# Patient Record
Sex: Female | Born: 1990 | Race: White | Hispanic: No | Smoking: Former smoker
Health system: Southern US, Community
[De-identification: ages and names within clinical notes are randomized; demographics above are authoritative.]

## PROBLEM LIST (undated history)

## (undated) ENCOUNTER — Ambulatory Visit: Admission: EM | Payer: Medicaid Other

## (undated) DIAGNOSIS — F419 Anxiety disorder, unspecified: Secondary | ICD-10-CM

## (undated) DIAGNOSIS — J45909 Unspecified asthma, uncomplicated: Secondary | ICD-10-CM

## (undated) DIAGNOSIS — Z8711 Personal history of peptic ulcer disease: Secondary | ICD-10-CM

## (undated) DIAGNOSIS — Z8719 Personal history of other diseases of the digestive system: Secondary | ICD-10-CM

## (undated) HISTORY — PX: NO PAST SURGERIES: SHX2092

---
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
## 2011-05-07 ENCOUNTER — Observation Stay: Payer: Self-pay | Admitting: Surgery

## 2011-09-04 ENCOUNTER — Observation Stay: Payer: Self-pay | Admitting: Advanced Practice Midwife

## 2011-09-04 LAB — URINALYSIS, COMPLETE
Glucose,UR: NEGATIVE mg/dL (ref 0–75)
Nitrite: NEGATIVE
Protein: NEGATIVE
RBC,UR: 2 /HPF (ref 0–5)
Squamous Epithelial: 13

## 2011-09-06 LAB — URINE CULTURE

## 2011-10-03 ENCOUNTER — Inpatient Hospital Stay: Payer: Self-pay

## 2011-10-03 ENCOUNTER — Ambulatory Visit: Payer: Self-pay | Admitting: Advanced Practice Midwife

## 2011-10-03 LAB — CBC WITH DIFFERENTIAL/PLATELET
Basophil %: 0.3 %
Eosinophil #: 0.1 10*3/uL (ref 0.0–0.7)
Eosinophil %: 0.4 %
HCT: 31.3 % — ABNORMAL LOW (ref 35.0–47.0)
HGB: 10.4 g/dL — ABNORMAL LOW (ref 12.0–16.0)
Lymphocyte %: 20.4 %
MCH: 28.7 pg (ref 26.0–34.0)
MCV: 86 fL (ref 80–100)
Monocyte #: 1 10*3/uL — ABNORMAL HIGH (ref 0.0–0.7)
Monocyte %: 7.6 %
Neutrophil %: 71.3 %
WBC: 13.7 10*3/uL — ABNORMAL HIGH (ref 3.6–11.0)

## 2011-10-05 LAB — HEMOGLOBIN: HGB: 10.4 g/dL — ABNORMAL LOW (ref 12.0–16.0)

## 2012-05-19 ENCOUNTER — Emergency Department: Payer: Self-pay | Admitting: Emergency Medicine

## 2013-01-13 IMAGING — US ABDOMEN ULTRASOUND LIMITED
1 series · 17 of 25 positions shown · non-contrast
Comparison: none

REASON FOR EXAM: 36 weeks RUQ pain
COMMENTS:

[Series 1: abdomen ultrasound limited · 17 of 99 slices shown]
[im 1/99]
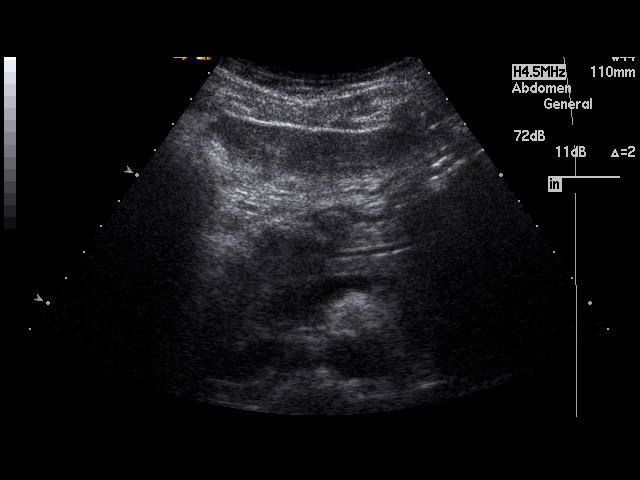
[im 9/99]
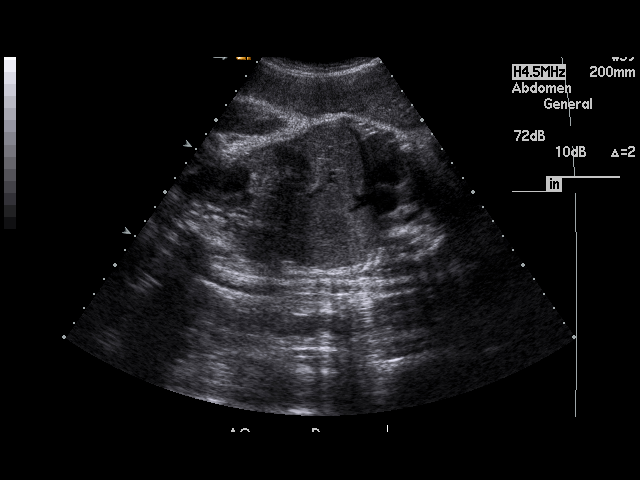
[im 13/99]
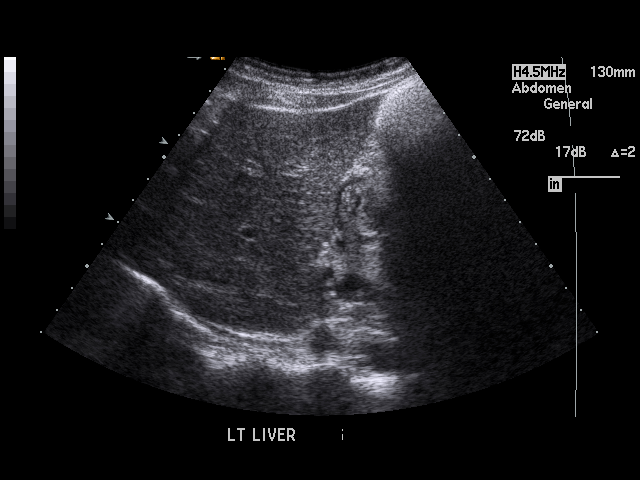
[im 21/99]
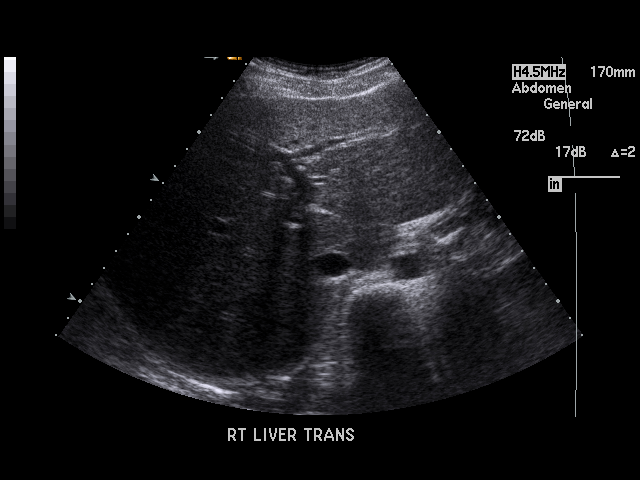
[im 25/99]
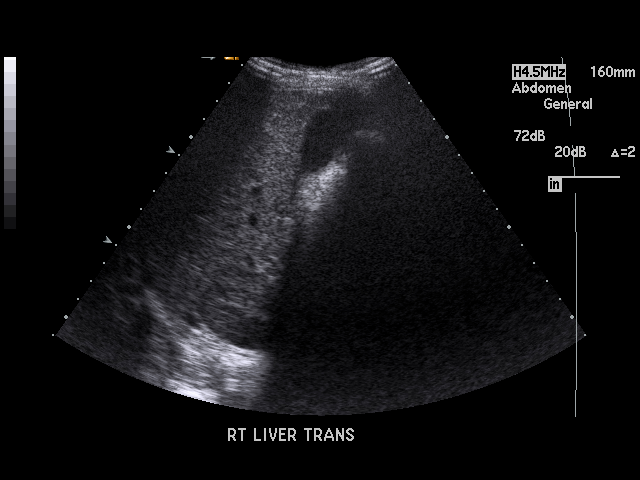
[im 33/99]
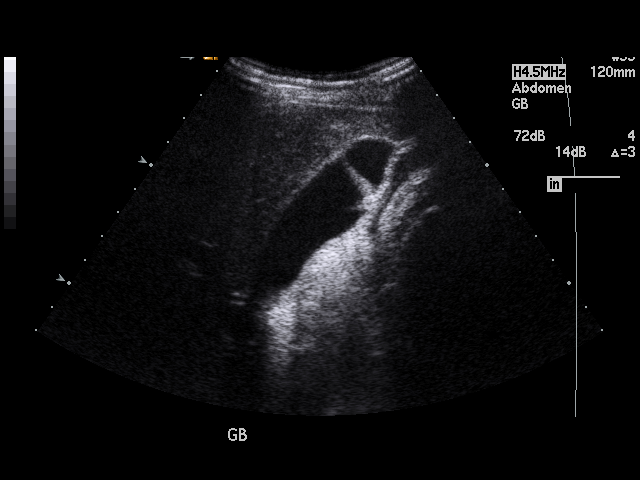
[im 37/99]
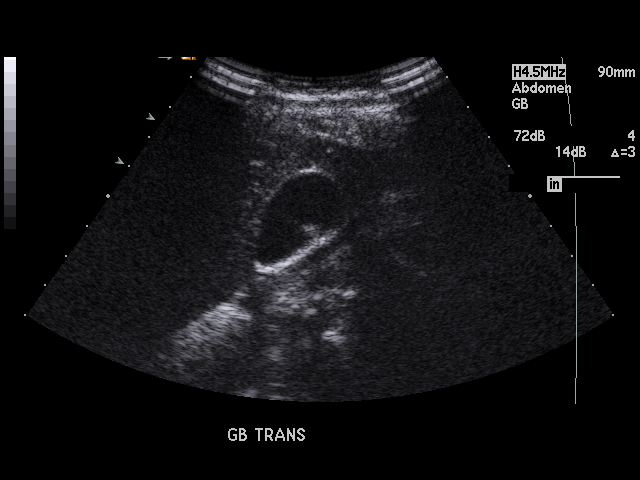
[im 45/99]
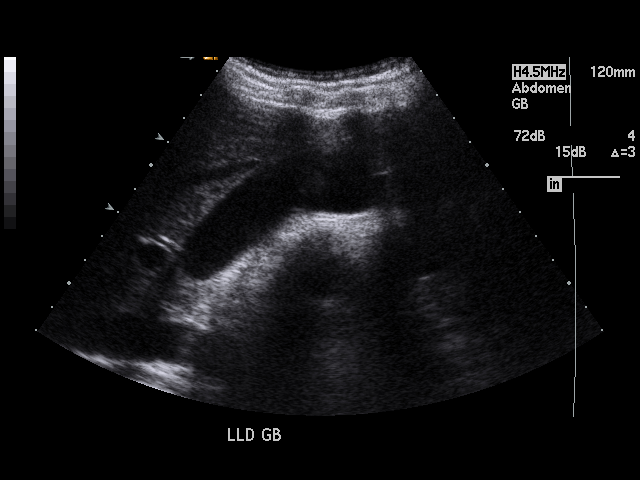
[im 50/99]
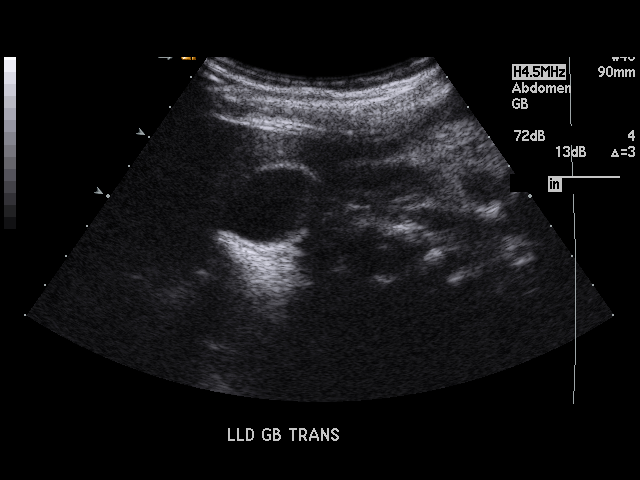
[im 54/99]
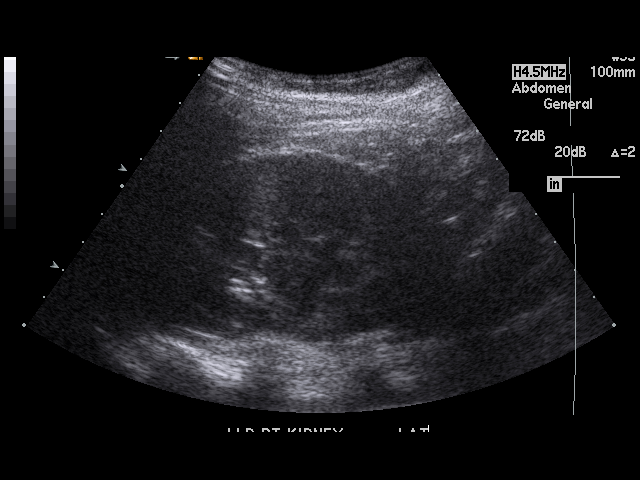
[im 62/99]
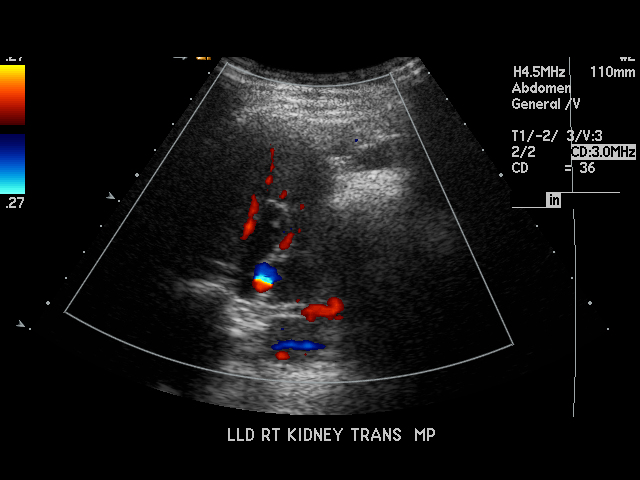
[im 66/99]
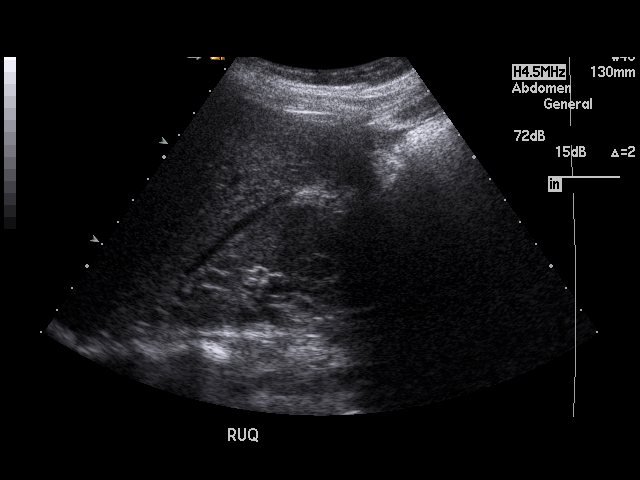
[im 74/99]
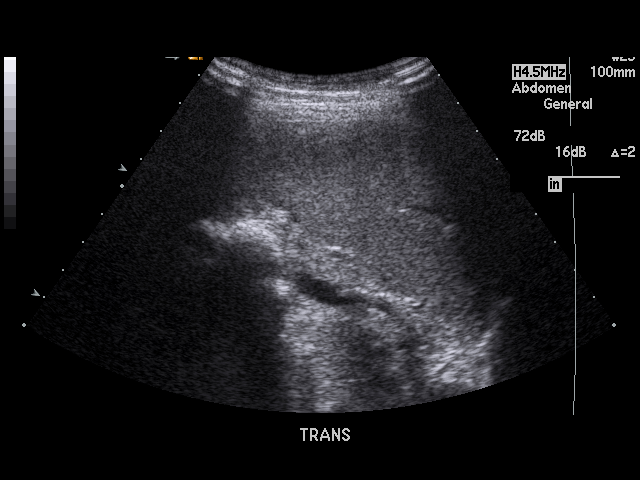
[im 78/99]
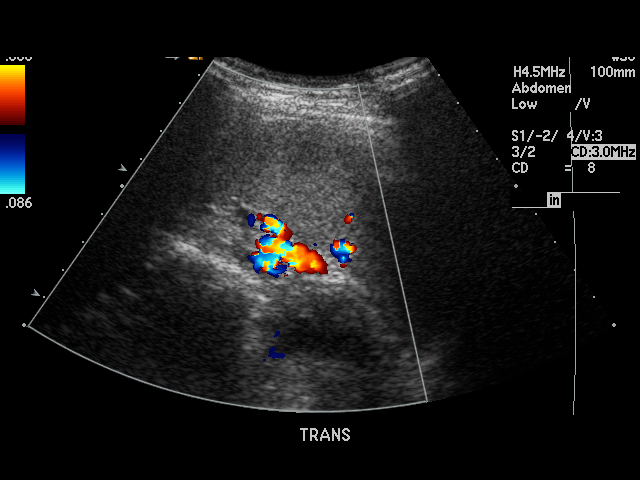
[im 86/99]
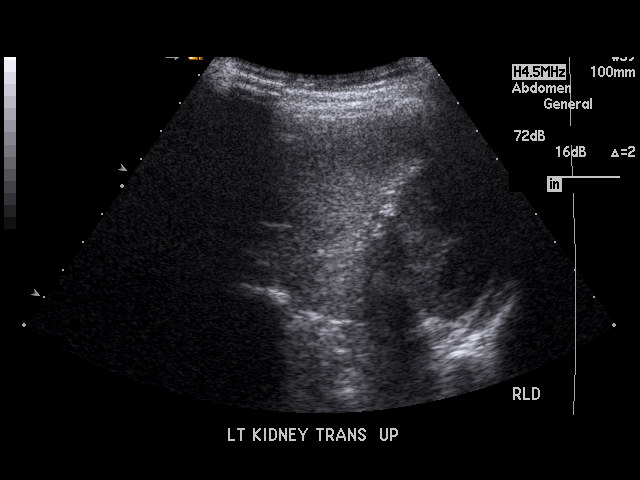
[im 90/99]
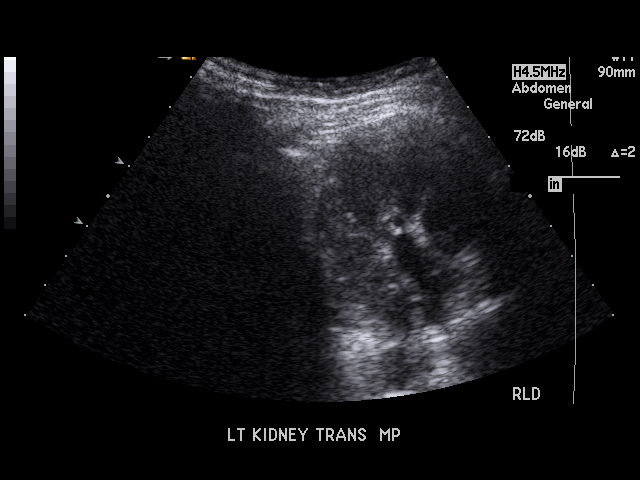
[im 99/99]
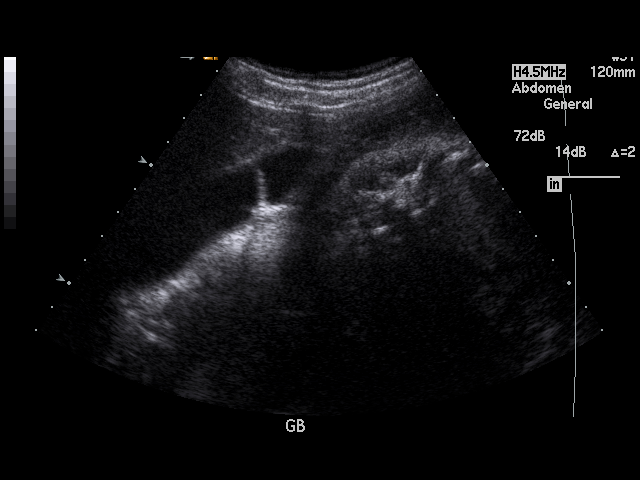

[17 of 25 positions shown; findings below may reference images not displayed]

PROCEDURE:     SYVERSON - SYVERSON ABDOMEN UPPER GENERAL  - October 03, 2011  [DATE]

RESULT:     Abdominal sonogram is obtained. The patient is approximately 37
weeks pregnant. A phrygian cap is noted in the gallbladder region. No stones
are evident. There is no sonographic Murphy's sign. The liver shows a normal
echotexture. The visualized pancreas and spleen appear unremarkable. The
aorta and inferior vena cava appear unremarkable in the included portions.
Gallbladder wall thickness is 2.5 mm. The common bile duct diameter is
mm. The portal venous flow is normal.

Right kidney length is 10.55 cm. Left kidney length is 10.27 cm. Both
kidneys show a mild degree of hydronephrosis to possibly approaching
moderate hydronephrosis. Ureteral jets could not be demonstrated at the
bladder. The bladder was nondistended. The fetal head is low in the pelvis.
Incidentally noted and not fully investigated on this exam of the abdomen is
what appears to be a paucity of amnionic fluid. Further investigation of
that is suggested. The office was called with this finding.
IMPRESSION: Mild bilateral hydronephrosis. Ureteral jets not
demonstrated at the bladder. No significant images were obtained but the
limited impression shows findings concerning for oligohydramnios. This was
communicated to the physician's office.

## 2013-02-28 ENCOUNTER — Emergency Department: Payer: Self-pay | Admitting: Internal Medicine

## 2013-11-11 ENCOUNTER — Emergency Department: Payer: Self-pay | Admitting: Emergency Medicine

## 2013-11-11 LAB — CBC WITH DIFFERENTIAL/PLATELET
Basophil #: 0.1 10*3/uL (ref 0.0–0.1)
Basophil %: 1 %
EOS ABS: 0.1 10*3/uL (ref 0.0–0.7)
EOS PCT: 1.2 %
HCT: 41 % (ref 35.0–47.0)
HGB: 13.6 g/dL (ref 12.0–16.0)
LYMPHS ABS: 3.9 10*3/uL — AB (ref 1.0–3.6)
LYMPHS PCT: 42.7 %
MCH: 29.3 pg (ref 26.0–34.0)
MCHC: 33.3 g/dL (ref 32.0–36.0)
MCV: 88 fL (ref 80–100)
MONO ABS: 0.6 x10 3/mm (ref 0.2–0.9)
Monocyte %: 6.2 %
Neutrophil #: 4.4 10*3/uL (ref 1.4–6.5)
Neutrophil %: 48.9 %
Platelet: 393 10*3/uL (ref 150–440)
RBC: 4.65 10*6/uL (ref 3.80–5.20)
RDW: 12.4 % (ref 11.5–14.5)
WBC: 9.1 10*3/uL (ref 3.6–11.0)

## 2013-11-11 LAB — URINALYSIS, COMPLETE
Bilirubin,UR: NEGATIVE
Glucose,UR: NEGATIVE mg/dL (ref 0–75)
Ketone: NEGATIVE
Nitrite: NEGATIVE
PH: 5 (ref 4.5–8.0)
PROTEIN: NEGATIVE
RBC,UR: 1 /HPF (ref 0–5)
Specific Gravity: 1.032 (ref 1.003–1.030)
WBC UR: 4 /HPF (ref 0–5)

## 2013-11-11 LAB — COMPREHENSIVE METABOLIC PANEL
ALK PHOS: 71 U/L
ALT: 14 U/L (ref 12–78)
ANION GAP: 6 — AB (ref 7–16)
Albumin: 4.1 g/dL (ref 3.4–5.0)
BILIRUBIN TOTAL: 0.2 mg/dL (ref 0.2–1.0)
BUN: 13 mg/dL (ref 7–18)
CO2: 27 mmol/L (ref 21–32)
CREATININE: 0.86 mg/dL (ref 0.60–1.30)
Calcium, Total: 9.1 mg/dL (ref 8.5–10.1)
Chloride: 104 mmol/L (ref 98–107)
EGFR (African American): >60
EGFR (Non-African Amer.): >60
GLUCOSE: 98 mg/dL (ref 65–99)
OSMOLALITY: 274 (ref 275–301)
POTASSIUM: 4 mmol/L (ref 3.5–5.1)
SGOT(AST): 12 U/L — ABNORMAL LOW (ref 15–37)
SODIUM: 137 mmol/L (ref 136–145)
TOTAL PROTEIN: 8.1 g/dL (ref 6.4–8.2)

## 2013-11-11 LAB — HCG, QUANTITATIVE, PREGNANCY: Beta Hcg, Quant.: <1 m[IU]/mL — ABNORMAL LOW

## 2013-11-25 DIAGNOSIS — F17200 Nicotine dependence, unspecified, uncomplicated: Secondary | ICD-10-CM | POA: Insufficient documentation

## 2014-05-18 ENCOUNTER — Emergency Department: Payer: Self-pay | Admitting: Emergency Medicine

## 2014-05-18 LAB — URINALYSIS, COMPLETE
BACTERIA: NONE SEEN
Bilirubin,UR: NEGATIVE
Blood: NEGATIVE
Glucose,UR: NEGATIVE mg/dL (ref 0–75)
Ketone: NEGATIVE
LEUKOCYTE ESTERASE: NEGATIVE
NITRITE: NEGATIVE
PH: 6 (ref 4.5–8.0)
Protein: NEGATIVE
RBC,UR: 1 /HPF (ref 0–5)
SPECIFIC GRAVITY: 1 (ref 1.003–1.030)
WBC UR: 1 /HPF (ref 0–5)

## 2014-05-18 LAB — CBC WITH DIFFERENTIAL/PLATELET
BASOS ABS: 0.1 10*3/uL (ref 0.0–0.1)
Basophil %: 0.5 %
EOS ABS: 0.2 10*3/uL (ref 0.0–0.7)
Eosinophil %: 2.3 %
HCT: 39.3 % (ref 35.0–47.0)
HGB: 12.8 g/dL (ref 12.0–16.0)
LYMPHS PCT: 42.1 %
Lymphocyte #: 3.9 10*3/uL — ABNORMAL HIGH (ref 1.0–3.6)
MCH: 29.1 pg (ref 26.0–34.0)
MCHC: 32.7 g/dL (ref 32.0–36.0)
MCV: 89 fL (ref 80–100)
MONOS PCT: 6.4 %
Monocyte #: 0.6 x10 3/mm (ref 0.2–0.9)
NEUTROS PCT: 48.7 %
Neutrophil #: 4.5 10*3/uL (ref 1.4–6.5)
PLATELETS: 347 10*3/uL (ref 150–440)
RBC: 4.42 10*6/uL (ref 3.80–5.20)
RDW: 12.6 % (ref 11.5–14.5)
WBC: 9.2 10*3/uL (ref 3.6–11.0)

## 2014-05-18 LAB — TROPONIN I

## 2014-05-18 LAB — COMPREHENSIVE METABOLIC PANEL
ALK PHOS: 81 U/L
ALT: 18 U/L
Albumin: 4.1 g/dL (ref 3.4–5.0)
Anion Gap: 9 (ref 7–16)
BUN: 10 mg/dL (ref 7–18)
Bilirubin,Total: 0.1 mg/dL — ABNORMAL LOW (ref 0.2–1.0)
CALCIUM: 8.5 mg/dL (ref 8.5–10.1)
CHLORIDE: 104 mmol/L (ref 98–107)
CO2: 28 mmol/L (ref 21–32)
Creatinine: 0.8 mg/dL (ref 0.60–1.30)
Glucose: 107 mg/dL — ABNORMAL HIGH (ref 65–99)
Osmolality: 281 (ref 275–301)
Potassium: 3.3 mmol/L — ABNORMAL LOW (ref 3.5–5.1)
SGOT(AST): 13 U/L — ABNORMAL LOW (ref 15–37)
SODIUM: 141 mmol/L (ref 136–145)
Total Protein: 8.1 g/dL (ref 6.4–8.2)

## 2014-05-18 LAB — LIPASE, BLOOD: Lipase: 126 U/L (ref 73–393)

## 2014-06-11 IMAGING — CR DG ANKLE COMPLETE 3+V*L*
1 series · 5 of 5 positions shown · non-contrast
Comparison: none

REASON FOR EXAM: l ankle pain
COMMENTS:

PROCEDURE:     DXR - DXR ANKLE LEFT COMPLETE  - February 28, 2013  [DATE]
RESULT:     There is no evidence of fracture, dislocation, or malalignment.

[Series 1: ap · 0.17mm/px · 5 of 5 slices shown]
[im 1/5]
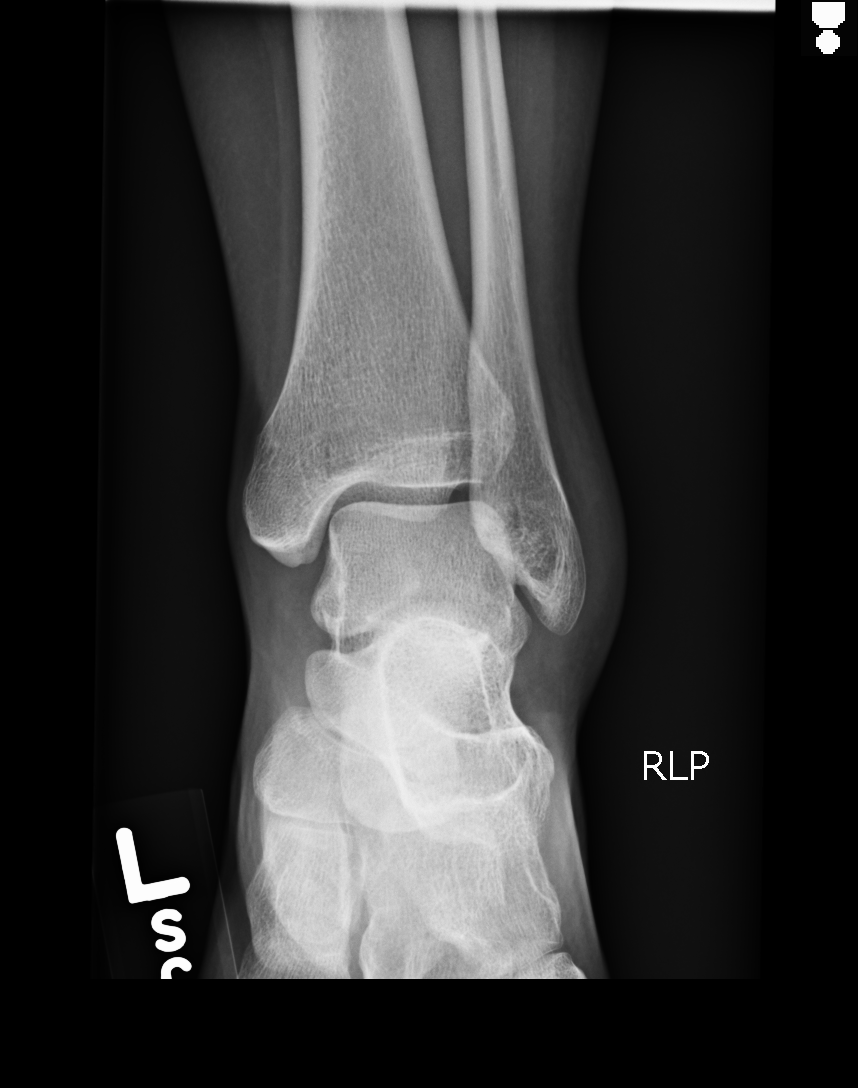
[im 2/5]
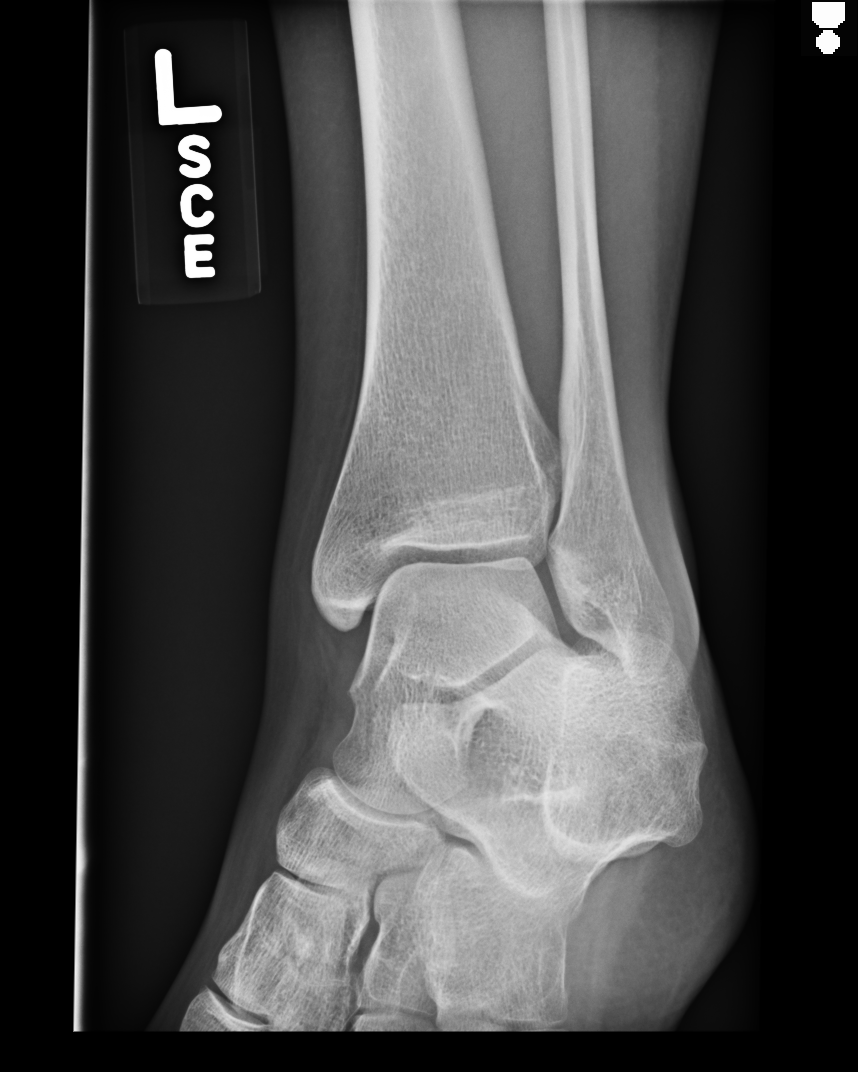
[im 3/5]
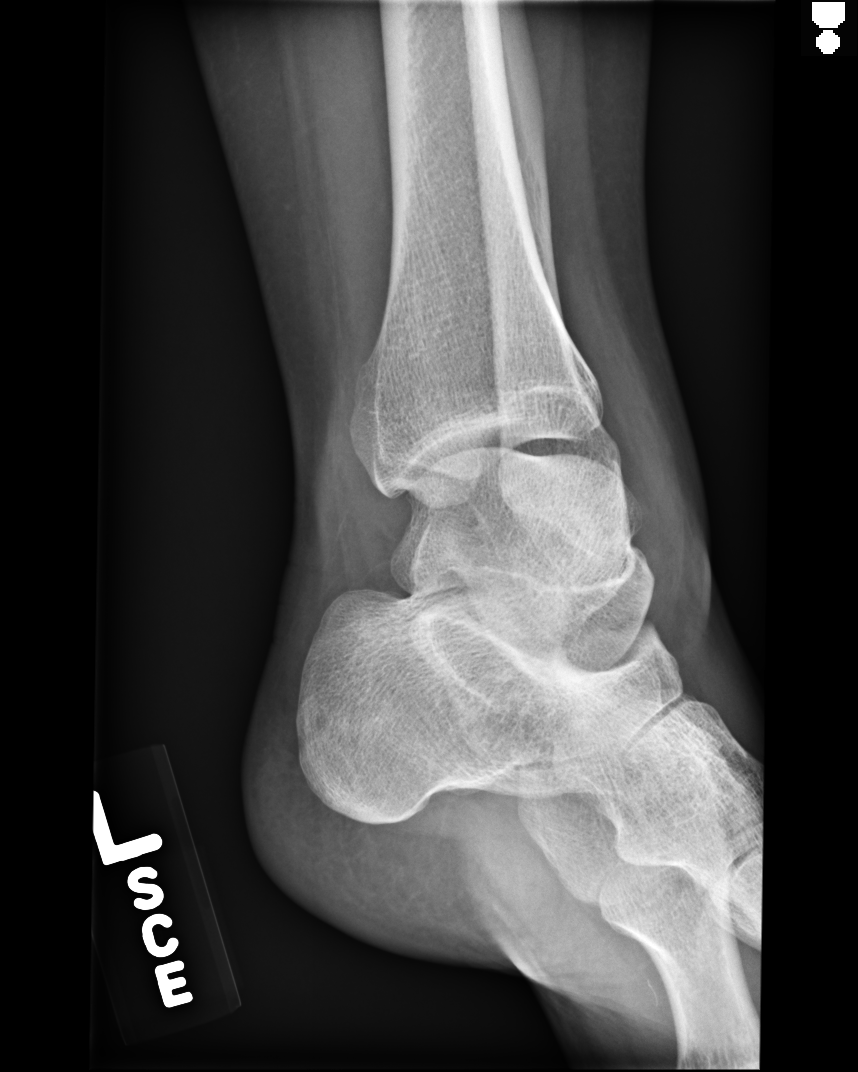
[im 4/5]
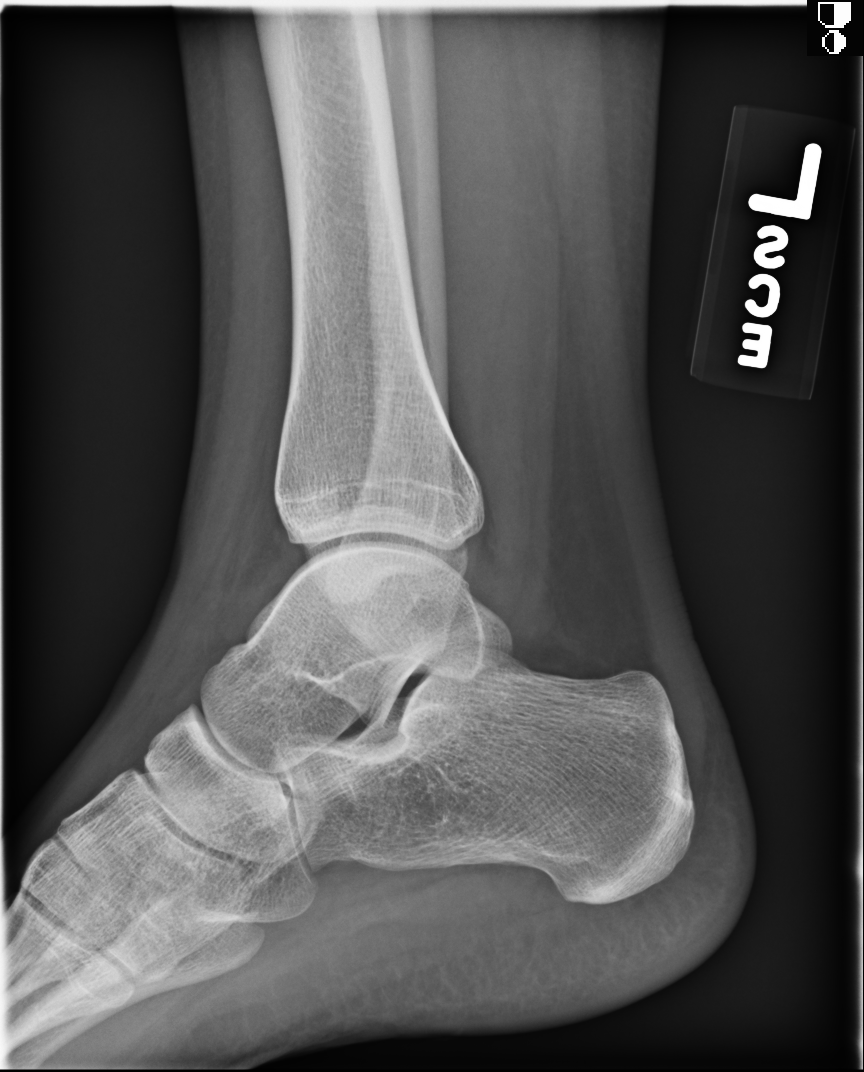
[im 5/5]
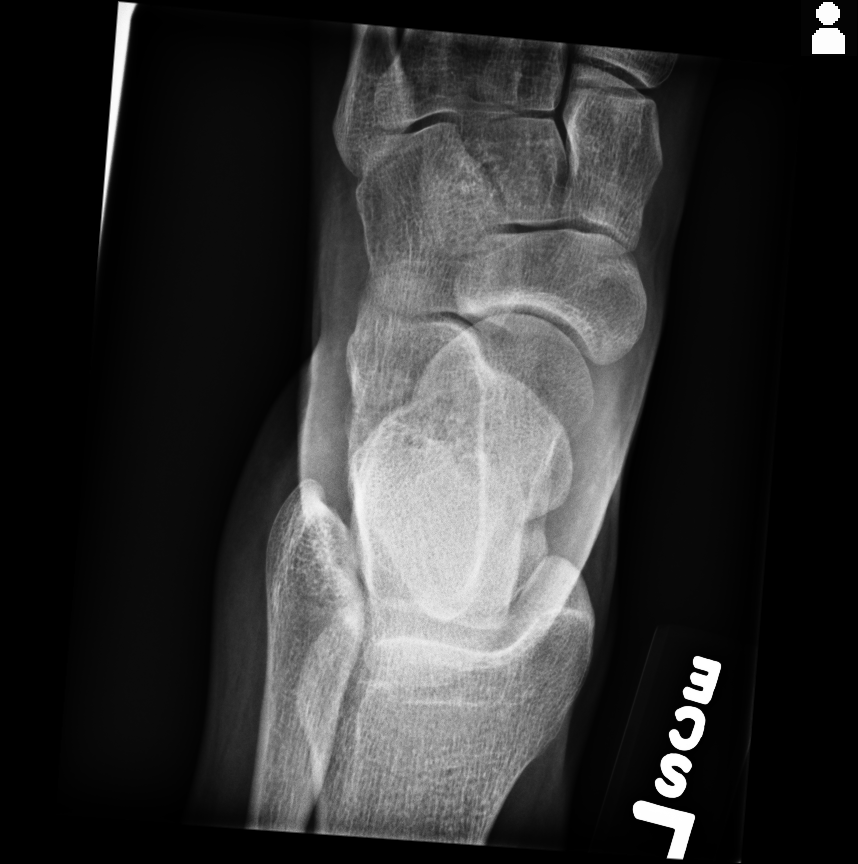

[5 of 5 positions shown; findings below may reference images not displayed]

IMPRESSION: 1. No evidence of acute abnormalities.
2. If there are persistent complaints of pain or persistent clinical
concern, a repeat evaluation in 7-10 days is recommended if clinically
warranted.

## 2014-06-13 ENCOUNTER — Emergency Department: Payer: Self-pay | Admitting: Emergency Medicine

## 2014-12-02 ENCOUNTER — Emergency Department
Admission: EM | Admit: 2014-12-02 | Discharge: 2014-12-03 | Disposition: A | Discharge status: Home / Self Care | Payer: Medicaid Other | Attending: Emergency Medicine | Admitting: Emergency Medicine

## 2014-12-02 DIAGNOSIS — Z3202 Encounter for pregnancy test, result negative: Secondary | ICD-10-CM | POA: Insufficient documentation

## 2014-12-02 DIAGNOSIS — Z79899 Other long term (current) drug therapy: Secondary | ICD-10-CM | POA: Insufficient documentation

## 2014-12-02 DIAGNOSIS — Z720 Tobacco use: Secondary | ICD-10-CM | POA: Insufficient documentation

## 2014-12-02 DIAGNOSIS — Z792 Long term (current) use of antibiotics: Secondary | ICD-10-CM | POA: Diagnosis not present

## 2014-12-02 DIAGNOSIS — N3001 Acute cystitis with hematuria: Secondary | ICD-10-CM | POA: Diagnosis not present

## 2014-12-02 DIAGNOSIS — R1030 Lower abdominal pain, unspecified: Secondary | ICD-10-CM | POA: Diagnosis present

## 2014-12-02 HISTORY — DX: Anxiety disorder, unspecified: F41.9

## 2014-12-02 HISTORY — DX: Personal history of other diseases of the digestive system: Z87.19

## 2014-12-02 HISTORY — DX: Personal history of peptic ulcer disease: Z87.11

## 2014-12-02 LAB — POCT PREGNANCY, URINE: Preg Test, Ur: NEGATIVE

## 2014-12-02 NOTE — ED Notes (Signed)
Patient ambulatory to triage with steady gait, without difficulty or distress noted; pt reports x 2 days having lower abd pain; worse today accomp by vag bleeding and dysuria

## 2014-12-03 ENCOUNTER — Encounter: Payer: Self-pay | Admitting: Emergency Medicine

## 2014-12-03 LAB — COMPREHENSIVE METABOLIC PANEL
ALBUMIN: 4.7 g/dL (ref 3.5–5.0)
ALT: 12 U/L — ABNORMAL LOW (ref 14–54)
AST: 16 U/L (ref 15–41)
Alkaline Phosphatase: 64 U/L (ref 38–126)
Anion gap: 9 (ref 5–15)
BILIRUBIN TOTAL: 0.3 mg/dL (ref 0.3–1.2)
BUN: 13 mg/dL (ref 6–20)
CHLORIDE: 104 mmol/L (ref 101–111)
CO2: 27 mmol/L (ref 22–32)
CREATININE: 0.9 mg/dL (ref 0.44–1.00)
Calcium: 9.4 mg/dL (ref 8.9–10.3)
GFR calc non Af Amer: >60 mL/min (ref >60)
Glucose, Bld: 112 mg/dL — ABNORMAL HIGH (ref 65–99)
Potassium: 3.9 mmol/L (ref 3.5–5.1)
Sodium: 140 mmol/L (ref 135–145)
Total Protein: 8.1 g/dL (ref 6.5–8.1)

## 2014-12-03 LAB — CBC WITH DIFFERENTIAL/PLATELET
Basophils Absolute: 0.1 10*3/uL (ref 0–0.1)
Basophils Relative: 1 %
EOS ABS: 0.1 10*3/uL (ref 0–0.7)
EOS PCT: 0 %
HCT: 42.1 % (ref 35.0–47.0)
HEMOGLOBIN: 13.9 g/dL (ref 12.0–16.0)
LYMPHS ABS: 2.2 10*3/uL (ref 1.0–3.6)
Lymphocytes Relative: 15 %
MCH: 28.8 pg (ref 26.0–34.0)
MCHC: 32.9 g/dL (ref 32.0–36.0)
MCV: 87.4 fL (ref 80.0–100.0)
MONOS PCT: 5 %
Monocytes Absolute: 0.7 10*3/uL (ref 0.2–0.9)
Neutro Abs: 12.3 10*3/uL — ABNORMAL HIGH (ref 1.4–6.5)
Neutrophils Relative %: 79 %
Platelets: 365 10*3/uL (ref 150–440)
RBC: 4.81 MIL/uL (ref 3.80–5.20)
RDW: 13 % (ref 11.5–14.5)
WBC: 15.4 10*3/uL — ABNORMAL HIGH (ref 3.6–11.0)

## 2014-12-03 LAB — URINALYSIS COMPLETE WITH MICROSCOPIC (ARMC ONLY)
Specific Gravity, Urine: 1.023 (ref 1.005–1.030)
Squamous Epithelial / LPF: NONE SEEN
pH: 0 — ABNORMAL LOW (ref 5.0–8.0)

## 2014-12-03 MED ORDER — PHENAZOPYRIDINE HCL 200 MG PO TABS: 200.0000 mg | ORAL_TABLET | Freq: Three times a day (TID) | ORAL | Status: DC | PRN

## 2014-12-03 MED ORDER — CEPHALEXIN 500 MG PO CAPS: 500.0000 mg | ORAL_CAPSULE | Freq: Three times a day (TID) | ORAL | Status: DC

## 2014-12-03 NOTE — ED Provider Notes (Signed)
[facility] Emergency Department Provider Note  ____________________________________________  Time seen: 2:10 AM  I have reviewed the triage vital signs and the nursing notes.   HISTORY  Chief Complaint Abdominal Pain    HPI Kendra Cole is a 24 y.o. female who reports urinary frequency and sharp lower abdominal pain for about 2 days. She reports that she is noting blood in her urine. Contrary to the triage that she actually denies vaginal bleeding or discharge. No fever or chills, no lightheadedness or dizziness, no chest pain no shortness of breath. She reports having some urinary tract infections in the past and this feels similar.     Past Medical History  Diagnosis Date  . Anxiety   . History of stomach ulcers     There are no active problems to display for this patient.   History reviewed. No pertinent past surgical history.  Current Outpatient Rx  Name  Route  Sig  Dispense  Refill  . ranitidine (ZANTAC) 150 MG tablet   Oral   Take 150 mg by mouth 2 (two) times daily.         . cephALEXin (KEFLEX) 500 MG capsule   Oral   Take 1 capsule (500 mg total) by mouth 3 (three) times daily.   21 capsule   0   . phenazopyridine (PYRIDIUM) 200 MG tablet   Oral   Take 1 tablet (200 mg total) by mouth 3 (three) times daily as needed for pain.   10 tablet   0     Allergies Review of patient's allergies indicates no known allergies.  No family history on file.  Social History History  Substance Use Topics  . Smoking status: Current Every Day Smoker  . Smokeless tobacco: Not on file  . Alcohol Use: Not on file    Review of Systems  Constitutional: No fever or chills. No weight changes Eyes:No blurry vision or double vision.  ENT: No sore throat. Cardiovascular: No chest pain. Respiratory: No dyspnea or cough. Gastrointestinal: Negative for abdominal pain, vomiting and diarrhea.  No BRBPR or melena. Genitourinary: Urinary  frequency, urgency, dysuria, hematuria Musculoskeletal: Negative for back pain. No joint swelling or pain. Skin: Negative for rash. Neurological: Negative for headaches, focal weakness or numbness. Psychiatric:No anxiety or depression.   Endocrine:No hot/cold intolerance, changes in energy, or sleep difficulty.  10-point ROS otherwise negative.  ____________________________________________   PHYSICAL EXAM:  VITAL SIGNS: ED Triage Vitals  Enc Vitals Group     BP 12/02/14 2314 132/94 mmHg     Pulse Rate 12/02/14 2314 88     Resp 12/03/14 0216 20     Temp 12/02/14 2314 98.3 F (36.8 C)     Temp Source 12/02/14 2314 Oral     SpO2 12/02/14 2314 100 %     Weight 12/02/14 2314 118 lb (53.524 kg)     Height 12/02/14 2314  (1.549 m)     Head Cir --      Peak Flow --      Pain Score 12/03/14 0216 0     Pain Loc --      Pain Edu? --      Excl. in GC? --      Constitutional: Alert and oriented. Well appearing and in no distress. Eyes: No scleral icterus. No conjunctival pallor. PERRL. EOMI ENT   Head: Normocephalic and atraumatic.   Nose: No congestion/rhinnorhea. No septal hematoma   Mouth/Throat: MMM, no pharyngeal erythema   Neck:  No stridor. No SubQ emphysema.  Hematological/Lymphatic/Immunilogical: No cervical lymphadenopathy. Cardiovascular: RRR. Normal and symmetric distal pulses are present in all extremities. No murmurs, rubs, or gallops. Respiratory: Normal respiratory effort without tachypnea nor retractions. Breath sounds are clear and equal bilaterally. No wheezes/rales/rhonchi. Gastrointestinal: Soft with suprapubic tenderness. No distention. There is no CVA tenderness.  No rebound, rigidity, or guarding. Genitourinary: deferred Musculoskeletal: Nontender with normal range of motion in all extremities. No joint effusions.  No lower extremity tenderness.  No edema. Neurologic:   Normal speech and language.  CN 2-10 normal. Motor grossly  intact. No pronator drift.  Normal gait. No gross focal neurologic deficits are appreciated.  Skin:  Skin is warm, dry and intact. No rash noted.  No petechiae, purpura, or bullae. Psychiatric: Mood and affect are normal. Speech and behavior are normal. Patient exhibits appropriate insight and judgment.  ____________________________________________    LABS (pertinent positives/negatives)  Results for orders placed or performed during the hospital encounter of 12/02/14  CBC with Differential  Result Value Ref Range   WBC 15.4 (H) 3.6 - 11.0 K/uL   RBC 4.81 3.80 - 5.20 MIL/uL   Hemoglobin 13.9 12.0 - 16.0 g/dL   HCT 04.542.1 40.935.0 - 81.147.0 %   MCV 87.4 80.0 - 100.0 fL   MCH 28.8 26.0 - 34.0 pg   MCHC 32.9 32.0 - 36.0 g/dL   RDW 91.413.0 78.211.5 - 95.614.5 %   Platelets 365 150 - 440 K/uL   Neutrophils Relative % 79 %   Neutro Abs 12.3 (H) 1.4 - 6.5 K/uL   Lymphocytes Relative 15 %   Lymphs Abs 2.2 1.0 - 3.6 K/uL   Monocytes Relative 5 %   Monocytes Absolute 0.7 0.2 - 0.9 K/uL   Eosinophils Relative 0 %   Eosinophils Absolute 0.1 0 - 0.7 K/uL   Basophils Relative 1 %   Basophils Absolute 0.1 0 - 0.1 K/uL  Comprehensive metabolic panel  Result Value Ref Range   Sodium 140 135 - 145 mmol/L   Potassium 3.9 3.5 - 5.1 mmol/L   Chloride 104 101 - 111 mmol/L   CO2 27 22 - 32 mmol/L   Glucose, Bld 112 (H) 65 - 99 mg/dL   BUN 13 6 - 20 mg/dL   Creatinine, Ser 2.130.90 0.44 - 1.00 mg/dL   Calcium 9.4 8.9 - 08.610.3 mg/dL   Total Protein 8.1 6.5 - 8.1 g/dL   Albumin 4.7 3.5 - 5.0 g/dL   AST 16 15 - 41 U/L   ALT 12 (L) 14 - 54 U/L   Alkaline Phosphatase 64 38 - 126 U/L   Total Bilirubin 0.3 0.3 - 1.2 mg/dL   GFR calc non Af Amer >60 >60 mL/min   GFR calc Af Amer >60 >60 mL/min   Anion gap 9 5 - 15  Urinalysis complete, with microscopic [facility])  Result Value Ref Range   Color, Urine RED (A) YELLOW   APPearance TURBID (A) CLEAR   Glucose, UA (A) NEGATIVE mg/dL    TEST NOT REPORTED DUE TO COLOR  INTERFERENCE OF URINE PIGMENT   Bilirubin Urine (A) NEGATIVE    TEST NOT REPORTED DUE TO COLOR INTERFERENCE OF URINE PIGMENT   Ketones, ur (A) NEGATIVE mg/dL    TEST NOT REPORTED DUE TO COLOR INTERFERENCE OF URINE PIGMENT   Specific Gravity, Urine 1.023 1.005 - 1.030   Hgb urine dipstick (A) NEGATIVE    TEST NOT REPORTED DUE TO COLOR INTERFERENCE OF URINE PIGMENT   pH 0.0 (  L) 5.0 - 8.0   Protein, ur (A) NEGATIVE mg/dL    TEST NOT REPORTED DUE TO COLOR INTERFERENCE OF URINE PIGMENT   Nitrite (A) NEGATIVE    TEST NOT REPORTED DUE TO COLOR INTERFERENCE OF URINE PIGMENT   Leukocytes, UA (A) NEGATIVE    TEST NOT REPORTED DUE TO COLOR INTERFERENCE OF URINE PIGMENT   RBC / HPF TOO NUMEROUS TO COUNT 0 - 5 RBC/hpf   WBC, UA TOO NUMEROUS TO COUNT 0 - 5 WBC/hpf   Bacteria, UA FEW (A) NONE SEEN   Squamous Epithelial / LPF NONE SEEN NONE SEEN   WBC Clumps PRESENT    Mucous PRESENT   Pregnancy, urine POC  Result Value Ref Range   Preg Test, Ur NEGATIVE NEGATIVE     ____________________________________________   EKG    ____________________________________________    RADIOLOGY    ____________________________________________   PROCEDURES  ____________________________________________   INITIAL IMPRESSION / ASSESSMENT AND PLAN / ED COURSE  Pertinent labs & imaging results that were available during my care of the patient were reviewed by me and considered in my medical decision making (see chart for details).  The patient presents with symptoms suggestive of urinary tract infection. Her labs confirm that she doesn't act cystitis with urinary tract infection. There is no evidence of pyelonephritis or sepsis. Evidence of torsion, ectopic pregnancy, or other surgical abdomen. The patient medically stable, no acute distress, feeling well and in good spirits. If any urine culture, I will discharge her home on Keflex and  pyridium.  ____________________________________________   FINAL CLINICAL IMPRESSION(S) / ED DIAGNOSES  Final diagnoses:  Acute cystitis with hematuria      Sharman CheekPhillip Brent Noto, MD 12/03/14 [id number]

## 2014-12-03 NOTE — Discharge Instructions (Signed)

## 2014-12-08 NOTE — H&P (Signed)
L&D Evaluation:  History:   HPI 24yo G1P0 with LMPof ?6/25/12d & L 05/07/71 with EDD of 10/23/11 here for "lower abd pains since yesterday", Feels like cramping, no LOF, VB   or decreased FM. Pt has sl increased Vag dc yellow but, no odor or itching.    Presents with abdominal pain, crampingh    Patient's Medical History Asthma  Panic attacks, Fx Lt wrist    Patient's Surgical History none    Medications Pre Natal Vitamins    Allergies Tums causes hives    Social History none    Family History Non-Contributory   ROS:   ROS All systems were reviewed.  HEENT, CNS, GI, GU, Respiratory, CV, Renal and Musculoskeletal systems were found to be normal.   Exam:   Vital Signs stable    General no apparent distress    Mental Status clear    Chest clear    Heart normal sinus rhythm, no murmur/gallop/rubs    Abdomen gravid, non-tender    Estimated Fetal Weight Average for gestational age    Back no CVAT    Edema 1+    Reflexes 1+    Clonus negative    Pelvic Cx:L/C/post    Mebranes Intact    FHT normal rate with no decels    Ucx regular, uterine irritabiltiy    Skin dry    Lymph no lymphadenopathy   Impression:   Impression Th PTL   Plan:   Plan Terb x 1 dose, hydration   Electronic Signatures: Sharee PimpleJones, Caron W (CNM)  (Signed 04-Feb-13 18:06)  Authored: L&D Evaluation   Last Updated: 04-Feb-13 18:06 by Sharee PimpleJones, Caron W (CNM)

## 2015-05-03 DIAGNOSIS — F339 Major depressive disorder, recurrent, unspecified: Secondary | ICD-10-CM | POA: Insufficient documentation

## 2016-06-13 ENCOUNTER — Encounter: Payer: Self-pay | Admitting: Emergency Medicine

## 2016-06-13 ENCOUNTER — Emergency Department
Admission: EM | Admit: 2016-06-13 | Discharge: 2016-06-13 | Disposition: A | Discharge status: Home / Self Care | Payer: Medicaid Other | Attending: Emergency Medicine | Admitting: Emergency Medicine

## 2016-06-13 DIAGNOSIS — F17200 Nicotine dependence, unspecified, uncomplicated: Secondary | ICD-10-CM | POA: Insufficient documentation

## 2016-06-13 DIAGNOSIS — N3000 Acute cystitis without hematuria: Secondary | ICD-10-CM | POA: Diagnosis not present

## 2016-06-13 DIAGNOSIS — R102 Pelvic and perineal pain: Secondary | ICD-10-CM | POA: Diagnosis present

## 2016-06-13 LAB — CBC
HEMATOCRIT: 40.8 % (ref 35.0–47.0)
Hemoglobin: 14 g/dL (ref 12.0–16.0)
MCH: 29.4 pg (ref 26.0–34.0)
MCHC: 34.3 g/dL (ref 32.0–36.0)
MCV: 85.7 fL (ref 80.0–100.0)
Platelets: 360 10*3/uL (ref 150–440)
RBC: 4.76 MIL/uL (ref 3.80–5.20)
RDW: 12.3 % (ref 11.5–14.5)
WBC: 7.6 10*3/uL (ref 3.6–11.0)

## 2016-06-13 LAB — COMPREHENSIVE METABOLIC PANEL
ALK PHOS: 51 U/L (ref 38–126)
ALT: 11 U/L — ABNORMAL LOW (ref 14–54)
AST: 19 U/L (ref 15–41)
Albumin: 4.5 g/dL (ref 3.5–5.0)
Anion gap: 9 (ref 5–15)
BILIRUBIN TOTAL: 0.4 mg/dL (ref 0.3–1.2)
BUN: 8 mg/dL (ref 6–20)
CO2: 25 mmol/L (ref 22–32)
Calcium: 9.4 mg/dL (ref 8.9–10.3)
Chloride: 101 mmol/L (ref 101–111)
Creatinine, Ser: 0.61 mg/dL (ref 0.44–1.00)
GFR calc Af Amer: >60 mL/min (ref >60)
GLUCOSE: 119 mg/dL — AB (ref 65–99)
POTASSIUM: 3.9 mmol/L (ref 3.5–5.1)
Sodium: 135 mmol/L (ref 135–145)
TOTAL PROTEIN: 8 g/dL (ref 6.5–8.1)

## 2016-06-13 LAB — URINALYSIS COMPLETE WITH MICROSCOPIC (ARMC ONLY)
Bilirubin Urine: NEGATIVE
GLUCOSE, UA: NEGATIVE mg/dL
Hgb urine dipstick: NEGATIVE
Ketones, ur: NEGATIVE mg/dL
NITRITE: NEGATIVE
Protein, ur: NEGATIVE mg/dL
SPECIFIC GRAVITY, URINE: 1.008 (ref 1.005–1.030)
pH: 6 (ref 5.0–8.0)

## 2016-06-13 LAB — WET PREP, GENITAL
CLUE CELLS WET PREP: NONE SEEN
SPERM: NONE SEEN
Trich, Wet Prep: NONE SEEN
Yeast Wet Prep HPF POC: NONE SEEN

## 2016-06-13 LAB — CHLAMYDIA/NGC RT PCR (ARMC ONLY)
CHLAMYDIA TR: NOT DETECTED
N GONORRHOEAE: NOT DETECTED

## 2016-06-13 LAB — POCT PREGNANCY, URINE: Preg Test, Ur: NEGATIVE

## 2016-06-13 LAB — LIPASE, BLOOD: Lipase: 20 U/L (ref 11–51)

## 2016-06-13 MED ORDER — DOXYCYCLINE MONOHYDRATE 100 MG PO CAPS: 100.0000 mg | ORAL_CAPSULE | Freq: Two times a day (BID) | ORAL | 0 refills | Status: AC

## 2016-06-13 NOTE — ED Provider Notes (Signed)
[facility] Emergency Department Provider Note  ____________________________________________  Time seen: Approximately 3:16 PM  I have reviewed the triage vital signs and the nursing notes.   HISTORY  Chief Complaint Pelvic Pain    HPI Kendra Cole is a 25 y.o. female, NAD, who presents to the emergency department with 2 day history of pelvic pain. She states that while having intercourse on Sunday she expereinced pelvic pain and lower abdominal discomfort. She reports that she had simliar symptoms one week prior with intercourse, but the pain subsided and she participated in intercourse between these two episodes without discomfort. She states she has had a mild fever (99.4 F) yesterday, nausea, and low back pain, but denies any dysuria, hematuria, vaginal bleeding, discharge, itching,  odor, post-coital bleeding, concerns for STDs, vomiting, or diarrhea. She is currently sexually active without protection and reports her menses is "9 days late", but has a history of irregular periods. LMP was 05/07/2016 and the one previous was 03/31/2016. She reports her last PAP smear was last year and it was normal.    Past Medical History:  Diagnosis Date  . Anxiety   . History of stomach ulcers     There are no active problems to display for this patient.   History reviewed. No pertinent surgical history.  Prior to Admission medications   Medication Sig Start Date End Date Taking? Authorizing Provider  cephALEXin (KEFLEX) 500 MG capsule Take 1 capsule (500 mg total) by mouth 3 (three) times daily. 12/03/14   Sharman CheekPhillip Stafford, MD  doxycycline (MONODOX) 100 MG capsule Take 1 capsule (100 mg total) by mouth 2 (two) times daily. 06/13/16 06/20/16  Jami L Hagler, PA-C  phenazopyridine (PYRIDIUM) 200 MG tablet Take 1 tablet (200 mg total) by mouth 3 (three) times daily as needed for pain. 12/03/14   Sharman CheekPhillip Stafford, MD  ranitidine (ZANTAC) 150 MG tablet Take 150 mg by mouth 2  (two) times daily.    Historical Provider, MD    Allergies Patient has no known allergies.  History reviewed. No pertinent family history.  Social History Social History  Substance Use Topics  . Smoking status: Current Every Day Smoker  . Smokeless tobacco: Never Used  . Alcohol use Yes     Review of Systems Constitutional: Positive for low grade fever Cardiovascular:  No chest pain Respiratory:  No shortness of breath Gastrointestinal: Positive for lower abdominal pain and nausea. No vomiting.  No diarrhea.   Genitourinary: Negative for dysuria, hematuria, vaginal discharge. No urinary hesitancy, urgency or increased frequency. Musculoskeletal:Positive for mild lower back pain without radiation Skin: Negative for rash, skin sores. 10-point ROS otherwise negative.  ____________________________________________   PHYSICAL EXAM:  VITAL SIGNS: ED Triage Vitals [06/13/16 1315]  Enc Vitals Group     BP 127/83     Pulse Rate 90     Resp 18     Temp 98.2 F (36.8 C)     Temp Source Oral     SpO2 100 %     Weight 118 lb (53.5 kg)     Height      Head Circumference      Peak Flow      Pain Score 1     Pain Loc      Pain Edu?      Excl. in GC?     Physical exam completed in the presence of Eartha InchJessica Clark, PA-S  Constitutional: Alert and oriented. Well appearing and in no acute distress. Eyes: Conjunctivae  are normal.   Head: Atraumatic.  Hematological/Lymphatic/Immunilogical: No inguinal lymphadenopathy. Cardiovascular: Normal rate, regular rhythm. Normal S1 and S2.  Good peripheral circulation. Respiratory: Normal respiratory effort without tachypnea or retractions.  Gastrointestinal: Bowel sounds grossly normoactive in all quadrants. Tender to deep palpation of the suprapubic region of the abdomen, without guarding or rebound. All other quadrants were soft with no distention or guarding. No rigidity or masses. No CVA tenderness. Genitourinary: Normal external female  genitalia. No skin lesions or sores noted. Vaginal wall without laceration or sores. Thick malodorous white discharge noted in posterior vaginal vault. Cervix was erythematous and friable with scant blood noted at the os. No discharge from cervical os.  Neurologic:  Normal speech and language. No gross focal neurologic deficits are appreciated.  Skin:  Skin is warm, dry and intact. No rash, redness, swelling, skin sores noted. Psychiatric: Mood and affect are normal. Speech and behavior are normal. Patient exhibits appropriate insight and judgement.   ____________________________________________   LABS (all labs ordered are listed, but only abnormal results are displayed)  Labs Reviewed  WET PREP, GENITAL - Abnormal; Notable for the following:       Result Value   WBC, Wet Prep HPF POC MODERATE (*)    All other components within normal limits  COMPREHENSIVE METABOLIC PANEL - Abnormal; Notable for the following:    Glucose, Bld 119 (*)    ALT 11 (*)    All other components within normal limits  URINALYSIS COMPLETEWITH MICROSCOPIC ([facility] ONLY) - Abnormal; Notable for the following:    Color, Urine YELLOW (*)    APPearance CLEAR (*)    Leukocytes, UA 2+ (*)    Bacteria, UA RARE (*)    Squamous Epithelial / LPF 0-5 (*)    All other components within normal limits  URINE CULTURE  CHLAMYDIA/NGC RT PCR ([facility] ONLY)  LIPASE, BLOOD  CBC  POC URINE PREG, ED  POCT PREGNANCY, URINE   ____________________________________________  EKG  None ____________________________________________  RADIOLOGY  None ____________________________________________    PROCEDURES  Procedure(s) performed: None   Procedures   Medications - No data to display   ____________________________________________   INITIAL IMPRESSION / ASSESSMENT AND PLAN / ED COURSE  Pertinent labs & imaging results that were available during my care of the patient were reviewed by me and considered in my  medical decision making (see chart for details).  Clinical Course     Patient's diagnosis is consistent with acute cystits. Patient will be discharged home with prescriptions for doxycycline to take as directed. Patient advised to abstain from sexual intercourse until all antibiotics are finished. She will be called with culture results when available and if medications need to be changed or added. Patient is to follow up with her OB/GYN or Dr. Valentino Saxon in 48 hours if symptoms persist past this treatment course. Patient is given ED precautions to return to the ED for any worsening or new symptoms.    ____________________________________________  FINAL CLINICAL IMPRESSION(S) / ED DIAGNOSES  Final diagnoses:  Acute cystitis without hematuria      NEW MEDICATIONS STARTED DURING THIS VISIT:  Discharge Medication List as of 06/13/2016  4:13 PM    START taking these medications   Details  doxycycline (MONODOX) 100 MG capsule Take 1 capsule (100 mg total) by mouth 2 (two) times daily., Starting Tue 06/13/2016, Until Tue 06/20/2016, Print             Hope Pigeon, PA-C 06/13/16 1641    Loraine Leriche  Fanny BienQuale, MD 06/16/16 1018

## 2016-06-13 NOTE — ED Triage Notes (Signed)
Pt to ed with c/o pelvic pain while having intercourse over the weekend.  Pt states the pain continued after the intercourse stopped.  Also reports missed period and +nausea.

## 2016-06-14 LAB — URINE CULTURE: SPECIAL REQUESTS: NORMAL

## 2016-09-19 DIAGNOSIS — R87612 Low grade squamous intraepithelial lesion on cytologic smear of cervix (LGSIL): Secondary | ICD-10-CM | POA: Insufficient documentation

## 2017-01-22 DIAGNOSIS — Z803 Family history of malignant neoplasm of breast: Secondary | ICD-10-CM | POA: Insufficient documentation

## 2017-04-24 DIAGNOSIS — F411 Generalized anxiety disorder: Secondary | ICD-10-CM | POA: Insufficient documentation

## 2017-09-17 DIAGNOSIS — Z0000 Encounter for general adult medical examination without abnormal findings: Secondary | ICD-10-CM | POA: Diagnosis present

## 2017-11-29 ENCOUNTER — Other Ambulatory Visit: Payer: Self-pay

## 2017-11-29 ENCOUNTER — Ambulatory Visit
Admission: EM | Admit: 2017-11-29 | Discharge: 2017-11-29 | Disposition: A | Discharge status: Home / Self Care | Payer: Medicaid Other | Attending: Family Medicine | Admitting: Family Medicine

## 2017-11-29 DIAGNOSIS — M7918 Myalgia, other site: Secondary | ICD-10-CM | POA: Diagnosis not present

## 2017-11-29 MED ORDER — DICLOFENAC SODIUM 75 MG PO TBEC: 75.0000 mg | DELAYED_RELEASE_TABLET | Freq: Two times a day (BID) | ORAL | 0 refills | Status: DC | PRN

## 2017-11-29 MED ORDER — CYCLOBENZAPRINE HCL 5 MG PO TABS: ORAL_TABLET | ORAL | 0 refills | Status: DC

## 2017-11-29 NOTE — ED Triage Notes (Signed)
Patient complains of right shoulder pain that started 2 weeks ago. Patient states that the pain is worse when she moves her arm. Patient states that the pain is keeping her from sleeping at night.

## 2017-11-29 NOTE — ED Provider Notes (Signed)
[facility]   CSN: [id number] Arrival date & time: 11/29/17  0840  History   Chief Complaint Chief Complaint  Patient presents with  . Shoulder Pain    Right   HPI  27 year old female presents with shoulder pain.  Patient reports that she has had a 2-week history of shoulder pain.  This is not truly shoulder pain as she localizes it to the trapezius and the periscapular region on the right.  No fall or injury.  She been using Tylenol, ibuprofen, and naproxen without improvement.  Worse with activity.  No relieving factors.  No reports paresthesias.  No other associated symptoms.  No other concerns at this time.  Past Medical History:  Diagnosis Date  . Anxiety   . History of stomach ulcers    Past Surgical History:  Procedure Laterality Date  . NO PAST SURGERIES     OB History    Gravida  1   Para      Term      Preterm      AB      Living        SAB      TAB      Ectopic      Multiple      Live Births  1          Home Medications    Prior to Admission medications   Medication Sig Start Date End Date Taking? Authorizing Provider  cyclobenzaprine (FLEXERIL) 5 MG tablet 1 tablet 3 times daily as needed for muscle pain/spasm. 11/29/17   Tommie Sams, DO  diclofenac (VOLTAREN) 75 MG EC tablet Take 1 tablet (75 mg total) by mouth 2 (two) times daily as needed. 11/29/17   Raeleigh Guinn G, DO  ISIBLOOM 0.15-30 MG-MCG tablet TK 1 T PO D FOR MENSTRUAL BLEEDING 11/20/17   [provider]   Family History No reported family hx.  Social History Social History   Tobacco Use  . Smoking status: Current Every Day Smoker    Packs/day: 0.50  . Smokeless tobacco: Never Used  Substance Use Topics  . Alcohol use: Not Currently  . Drug use: No    Allergies   Patient has no known allergies.  Review of Systems Review of Systems  Constitutional: Negative.   Musculoskeletal: Negative for neck pain.       Trapezius pain.   Physical  Exam Triage Vital Signs ED Triage Vitals  Enc Vitals Group     BP 11/29/17 0859 112/73     Pulse Rate 11/29/17 0859 84     Resp 11/29/17 0859 17     Temp 11/29/17 0859 98.3 F (36.8 C)     Temp Source 11/29/17 0859 Oral     SpO2 11/29/17 0859 99 %     Weight 11/29/17 0856 130 lb (59 kg)     Height 11/29/17 0856  (1.549 m)     Head Circumference --      Peak Flow --      Pain Score 11/29/17 0856 9     Pain Loc --      Pain Edu? --      Excl. in GC? --    Updated Vital Signs BP 112/73 (BP Location: Left Arm)   Pulse 84   Temp 98.3 F (36.8 C) (Oral)   Resp 17   Ht  (1.549 m)   Wt 130 lb (59 kg)   LMP 11/22/2017   SpO2 99%  BMI 24.56 kg/m   Physical Exam  Constitutional: She is oriented to person, place, and time. She appears well-developed. No distress.  Cardiovascular: Normal rate and regular rhythm.  Pulmonary/Chest: Effort normal and breath sounds normal. She has no wheezes. She has no rales.  Musculoskeletal:  Right shoulder -right trapezius tender to palpation.  Normal range of motion of the shoulder.  Normal muscle strength testing of the rotator cuff.  Negative Hawkins.  Neurological: She is alert and oriented to person, place, and time.  Psychiatric: She has a normal mood and affect. Her behavior is normal.  Nursing note and vitals reviewed.  UC Treatments / Results  Labs (all labs ordered are listed, but only abnormal results are displayed) Labs Reviewed - No data to display  EKG None  Radiology No results found.  Procedures Procedures (including critical care time)  Medications Ordered in UC Medications - No data to display  Initial Impression / Assessment and Plan / UC Course  I have reviewed the triage vital signs and the nursing notes.  Pertinent labs & imaging results that were available during my care of the patient were reviewed by me and considered in my medical decision making (see chart for details).    27 year old female  presents with muscular skeletal pain.  Treating with Flexeril and diclofenac.  Final Clinical Impressions(s) / UC Diagnoses   Final diagnoses:  Musculoskeletal pain   ED Prescriptions    Medication Sig Dispense Auth. Provider   diclofenac (VOLTAREN) 75 MG EC tablet Take 1 tablet (75 mg total) by mouth 2 (two) times daily as needed. 30 tablet Mavis Fichera G, DO   cyclobenzaprine (FLEXERIL) 5 MG tablet 1 tablet 3 times daily as needed for muscle pain/spasm. 30 tablet Tommie Sams, DO     Controlled Substance Prescriptions [place] Controlled Substance Registry consulted? Not Applicable   Tommie Sams, [place] 11/29/17 [phone]

## 2017-11-29 NOTE — Discharge Instructions (Signed)
Heat.  Use the medications as needed.  Take care  Dr. Adriana Simas

## 2018-03-19 DIAGNOSIS — R102 Pelvic and perineal pain: Secondary | ICD-10-CM | POA: Insufficient documentation

## 2018-03-23 ENCOUNTER — Encounter: Payer: Self-pay | Admitting: Intensive Care

## 2018-03-23 ENCOUNTER — Other Ambulatory Visit: Payer: Self-pay

## 2018-03-23 ENCOUNTER — Emergency Department
Admission: EM | Admit: 2018-03-23 | Discharge: 2018-03-23 | Disposition: A | Discharge status: Home / Self Care | Payer: Medicaid Other | Attending: Emergency Medicine | Admitting: Emergency Medicine

## 2018-03-23 ENCOUNTER — Emergency Department: Payer: Medicaid Other

## 2018-03-23 DIAGNOSIS — R102 Pelvic and perineal pain: Secondary | ICD-10-CM | POA: Diagnosis not present

## 2018-03-23 DIAGNOSIS — J45909 Unspecified asthma, uncomplicated: Secondary | ICD-10-CM | POA: Diagnosis not present

## 2018-03-23 DIAGNOSIS — F17210 Nicotine dependence, cigarettes, uncomplicated: Secondary | ICD-10-CM | POA: Insufficient documentation

## 2018-03-23 HISTORY — DX: Unspecified asthma, uncomplicated: J45.909

## 2018-03-23 LAB — URINALYSIS, COMPLETE (UACMP) WITH MICROSCOPIC
BACTERIA UA: NONE SEEN
Bilirubin Urine: NEGATIVE
GLUCOSE, UA: NEGATIVE mg/dL
Ketones, ur: NEGATIVE mg/dL
Nitrite: NEGATIVE
Protein, ur: 30 mg/dL — AB
RBC / HPF: >50 RBC/hpf — ABNORMAL HIGH (ref 0–5)
SPECIFIC GRAVITY, URINE: 1.024 (ref 1.005–1.030)
pH: 7 (ref 5.0–8.0)

## 2018-03-23 LAB — COMPREHENSIVE METABOLIC PANEL
ALBUMIN: 4.4 g/dL (ref 3.5–5.0)
ALT: 16 U/L (ref 0–44)
AST: 28 U/L (ref 15–41)
Alkaline Phosphatase: 61 U/L (ref 38–126)
Anion gap: 9 (ref 5–15)
BUN: 10 mg/dL (ref 6–20)
CO2: 20 mmol/L — AB (ref 22–32)
CREATININE: 0.75 mg/dL (ref 0.44–1.00)
Calcium: 9.1 mg/dL (ref 8.9–10.3)
Chloride: 109 mmol/L (ref 98–111)
GFR calc Af Amer: >60 mL/min (ref >60)
GFR calc non Af Amer: >60 mL/min (ref >60)
GLUCOSE: 104 mg/dL — AB (ref 70–99)
Potassium: 4.1 mmol/L (ref 3.5–5.1)
SODIUM: 138 mmol/L (ref 135–145)
Total Bilirubin: 1 mg/dL (ref 0.3–1.2)
Total Protein: 7.7 g/dL (ref 6.5–8.1)

## 2018-03-23 LAB — CBC
HCT: 40.4 % (ref 35.0–47.0)
HEMOGLOBIN: 14.1 g/dL (ref 12.0–16.0)
MCH: 29.5 pg (ref 26.0–34.0)
MCHC: 35 g/dL (ref 32.0–36.0)
MCV: 84.2 fL (ref 80.0–100.0)
Platelets: 417 10*3/uL (ref 150–440)
RBC: 4.8 MIL/uL (ref 3.80–5.20)
RDW: 12.4 % (ref 11.5–14.5)
WBC: 10.1 10*3/uL (ref 3.6–11.0)

## 2018-03-23 LAB — LIPASE, BLOOD: Lipase: 27 U/L (ref 11–51)

## 2018-03-23 LAB — HCG, QUANTITATIVE, PREGNANCY

## 2018-03-23 MED ORDER — KETOROLAC TROMETHAMINE 30 MG/ML IJ SOLN
15.0000 mg | Freq: Once | INTRAMUSCULAR | Status: AC
  Administered 2018-03-23: 15 mg via INTRAVENOUS

## 2018-03-23 MED ORDER — CEPHALEXIN 500 MG PO CAPS: 500.0000 mg | ORAL_CAPSULE | Freq: Two times a day (BID) | ORAL | 0 refills | Status: DC

## 2018-03-23 MED ORDER — KETOROLAC TROMETHAMINE 30 MG/ML IJ SOLN
INTRAMUSCULAR | Status: AC
  Administered 2018-03-23: 15 mg via INTRAVENOUS
  Filled 2018-03-23: qty 1

## 2018-03-23 NOTE — ED Notes (Signed)
Peripheral IV discontinued. Catheter intact. No signs of infiltration or redness. Gauze applied to IV site.   Discharge instructions reviewed with patient. Questions fielded by this RN. Patient verbalizes understanding of instructions. Patient discharged home in stable condition per Kinner. No acute distress noted at time of discharge.   

## 2018-03-23 NOTE — ED Provider Notes (Signed)
[facility] Emergency Department Provider Note   ____________________________________________    I have reviewed the triage vital signs and the nursing notes.   HISTORY  Chief Complaint Pelvic pain, vaginal bleeding    HPI Kendra Cole is a 27 y.o. female who present with complaints of pelvic pain and vaginal bleeding.  Patient reports she started having mild bleeding today.  She described cramping which was moderate to severe at times.  It is improved now.  She reports she is on a Depakote shot.  Does not usually have periods.  No fevers or chills.  No dysuria.  No nausea or vomiting.  She has never had this before.  Past Medical History:  Diagnosis Date  . Anxiety   . Asthma   . History of stomach ulcers     There are no active problems to display for this patient.   Past Surgical History:  Procedure Laterality Date  . NO PAST SURGERIES      Prior to Admission medications   Medication Sig Start Date End Date Taking? Authorizing Provider  cephALEXin (KEFLEX) 500 MG capsule Take 1 capsule (500 mg total) by mouth 2 (two) times daily. 03/23/18   Jene Every, MD  cyclobenzaprine (FLEXERIL) 5 MG tablet 1 tablet 3 times daily as needed for muscle pain/spasm. 11/29/17   Tommie Sams, DO  diclofenac (VOLTAREN) 75 MG EC tablet Take 1 tablet (75 mg total) by mouth 2 (two) times daily as needed. 11/29/17   Cook, Jayce G, DO  ISIBLOOM 0.15-30 MG-MCG tablet TK 1 T PO D FOR MENSTRUAL BLEEDING 11/20/17   [provider]     Allergies Patient has no known allergies.  History reviewed. No pertinent family history.  Social History Social History   Tobacco Use  . Smoking status: Current Every Day Smoker    Packs/day: 0.50    Types: Cigarettes  . Smokeless tobacco: Never Used  Substance Use Topics  . Alcohol use: Not Currently  . Drug use: No    Review of Systems  Constitutional: No fever/chills Eyes: No visual changes.  ENT: No sore  throat. Cardiovascular: Denies chest pain. Respiratory: Denies shortness of breath. Gastrointestinal: As above Genitourinary: Negative for dysuria. Musculoskeletal: Negative for back pain. Skin: Negative for rash. Neurological: Negative for headaches or weakness   ____________________________________________   PHYSICAL EXAM:  VITAL SIGNS: ED Triage Vitals  Enc Vitals Group     BP 03/23/18 1750 134/76     Pulse Rate 03/23/18 1750 97     Resp 03/23/18 1750 18     Temp 03/23/18 1750 98.6 F (37 C)     Temp Source 03/23/18 1750 Oral     SpO2 03/23/18 1750 100 %     Weight 03/23/18 1757 57.6 kg (127 lb)     Height 03/23/18 1757 1.549 m (5\' 1" )     Head Circumference --      Peak Flow --      Pain Score 03/23/18 1803 0     Pain Loc --      Pain Edu? --      Excl. in GC? --     Constitutional: Alert and oriented. No acute distress. Pleasant and interactive  Nose: No congestion/rhinnorhea. Mouth/Throat: Mucous membranes are moist.    Cardiovascular: Normal rate, regular rhythm. Grossly normal heart sounds.  Good peripheral circulation. Respiratory: Normal respiratory effort.  No retractions. Gastrointestinal: Soft and nontender. No distention.  No CVA tenderness. Musculoskeletal: No lower extremity  tenderness nor edema.  Neurologic:  Normal speech and language. No gross focal neurologic deficits are appreciated.  Skin:  Skin is warm, dry and intact. No rash noted. Psychiatric: Mood and affect are normal. Speech and behavior are normal.  ____________________________________________   LABS (all labs ordered are listed, but only abnormal results are displayed)  Labs Reviewed  COMPREHENSIVE METABOLIC PANEL - Abnormal; Notable for the following components:      Result Value   CO2 20 (*)    Glucose, Bld 104 (*)    All other components within normal limits  URINALYSIS, COMPLETE (UACMP) WITH MICROSCOPIC - Abnormal; Notable for the following components:   Color, Urine  YELLOW (*)    APPearance HAZY (*)    Hgb urine dipstick LARGE (*)    Protein, ur 30 (*)    Leukocytes, UA TRACE (*)    RBC / HPF >50 (*)    All other components within normal limits  LIPASE, BLOOD  CBC  HCG, QUANTITATIVE, PREGNANCY   ____________________________________________  EKG  None ____________________________________________  RADIOLOGY  Ultrasound pelvis unremarkable ____________________________________________   PROCEDURES  Procedure(s) performed: No  Procedures   Critical Care performed: No ____________________________________________   INITIAL IMPRESSION / ASSESSMENT AND PLAN / ED COURSE  Pertinent labs & imaging results that were available during my care of the patient were reviewed by me and considered in my medical decision making (see chart for details).  Patient well-appearing in no acute distress.  hCG negative.  Exam is overall reassuring.  Pelvic ultrasound benign.  Patient's pain resolved without intervention.  Unclear cause of discomfort.  No further vaginal bleeding.  Will follow-up with GYN, return precautions discussed    ____________________________________________   FINAL CLINICAL IMPRESSION(S) / ED DIAGNOSES  Final diagnoses:  Pelvic pain        Note:  This document was prepared using Dragon voice recognition software and may include unintentional dictation errors.    Jene EveryKinner, Thomas Mabry, MD 03/23/18 2258

## 2018-03-23 NOTE — ED Triage Notes (Addendum)
Patient arrived by EMS from home for vaginal bleeding X4 days and lower back pain X 3weeks. Patient brought in a bag with some d/c from her that looks like mostly tissue and possibly a miscarriage. Patient is passing clots, having lower abdominal pain, and having sharp pains every 3-4 minutes. Patient reports [facility] and not told if she was pregnant or not but being treated for a UTI

## 2018-03-23 NOTE — ED Notes (Signed)
ED Provider at bedside. 

## 2018-07-05 DIAGNOSIS — G4700 Insomnia, unspecified: Secondary | ICD-10-CM | POA: Insufficient documentation

## 2019-07-04 IMAGING — US US PELVIS COMPLETE
1 series · 13 of 25 positions shown · non-contrast
Comparison: None

CLINICAL DATA: Vaginal bleeding with clots.  Pelvic pain.



[Series 1: us pelvis complete · 66 acquisitions, 13 frames shown]
[im 1/66]
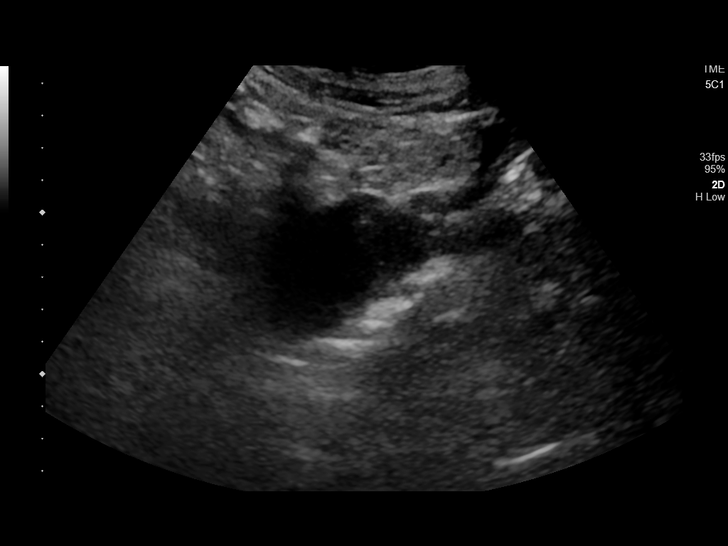
[im 6/66]
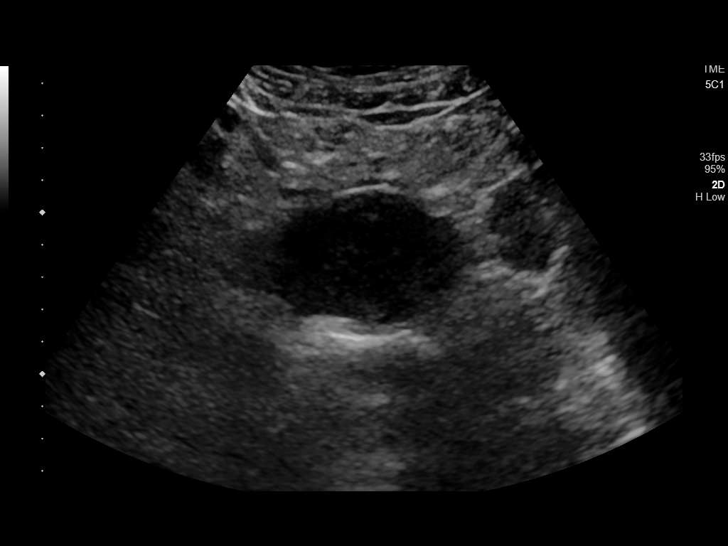
[im 11/66]
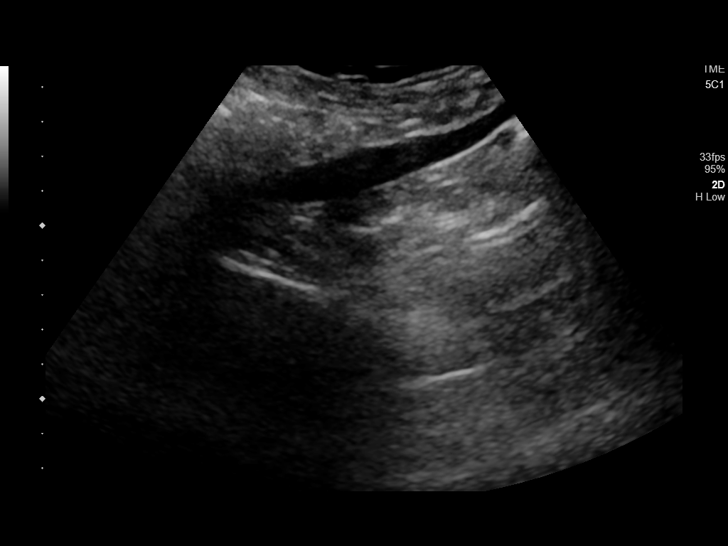
[im 17/66]
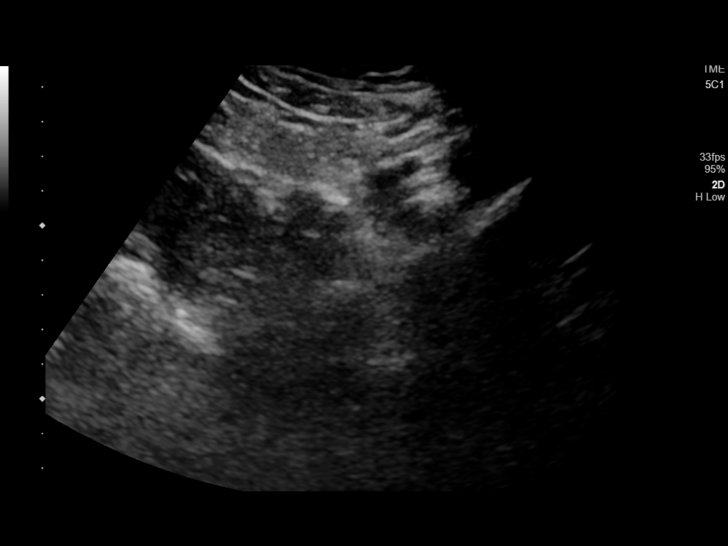
[im 22/66]
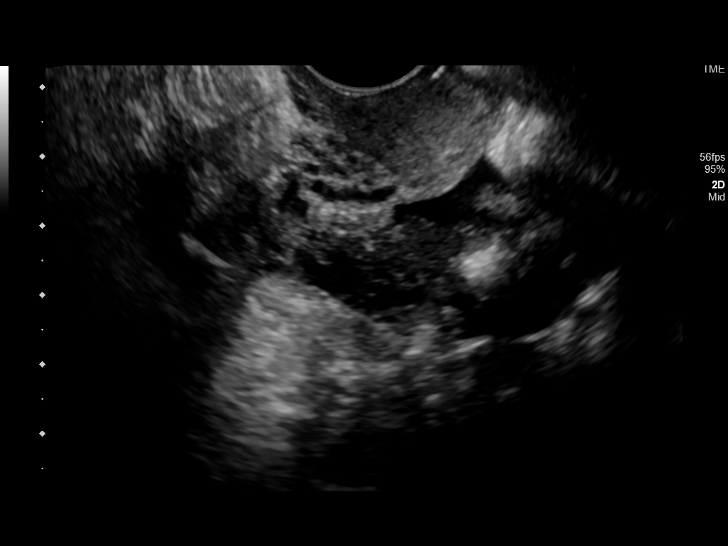
[im 28/66]
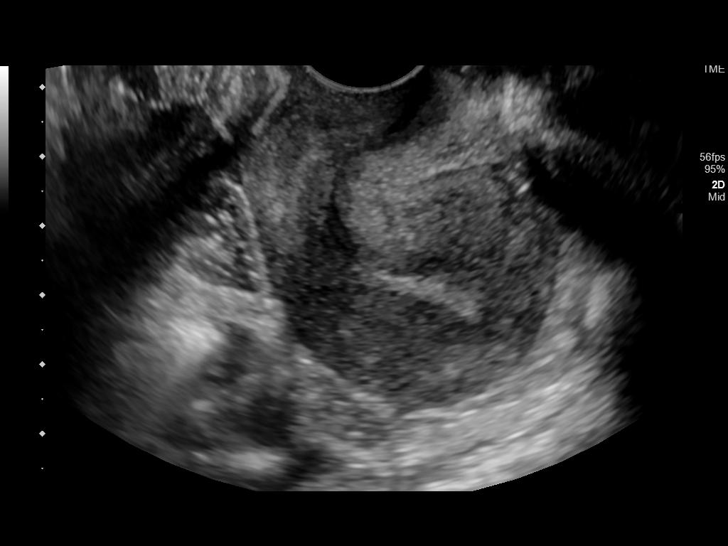
[im 33/66]
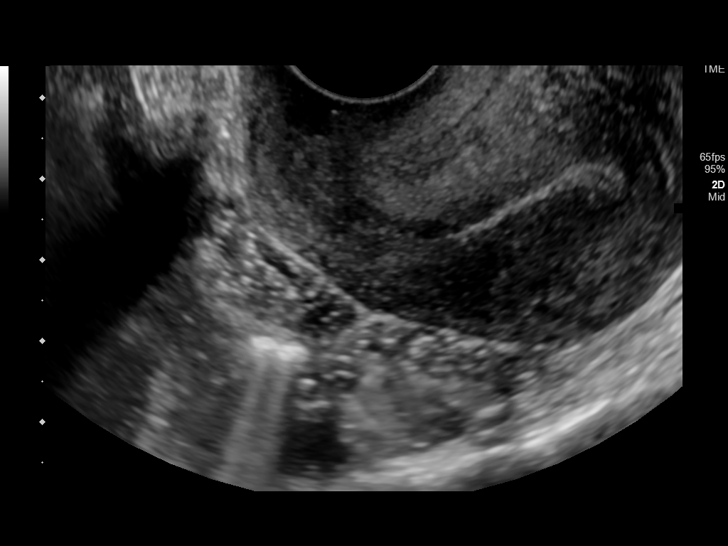
[im 38/66]
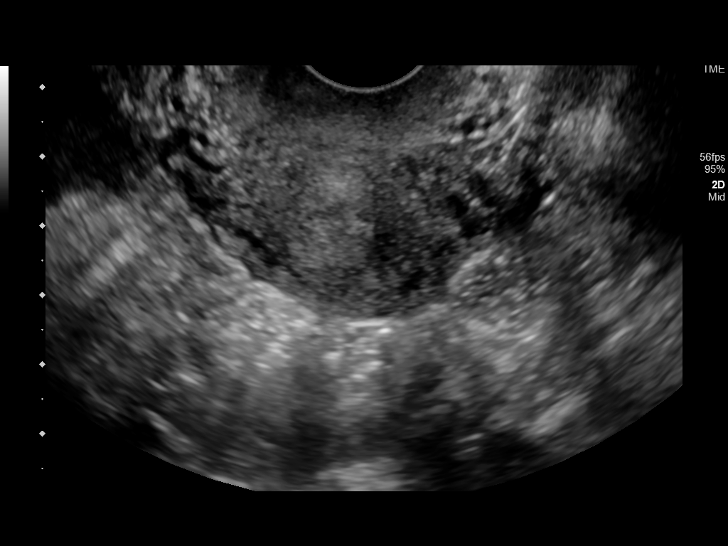
[im 44/66]
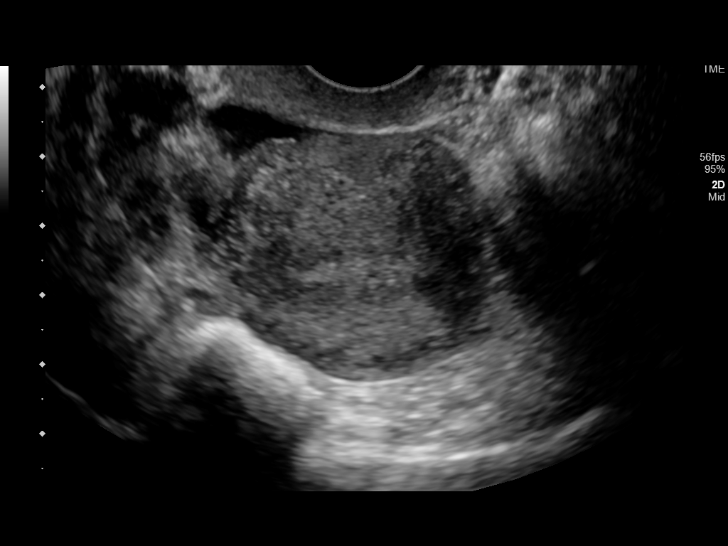
[im 49/66]
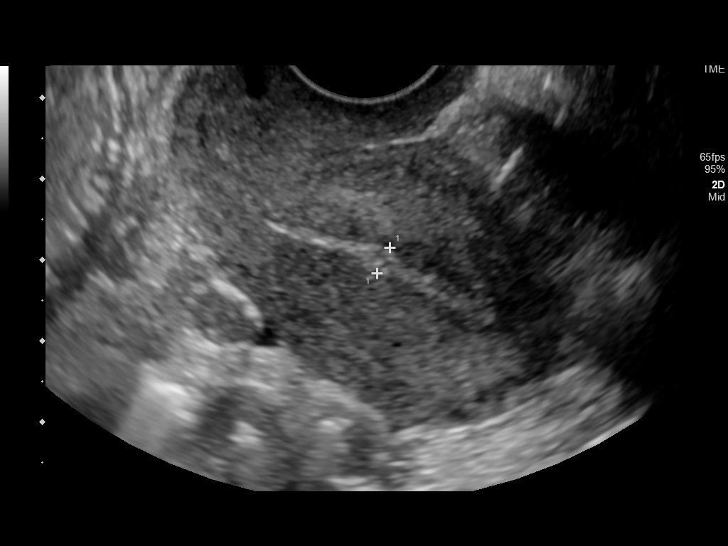
[im 55/66]
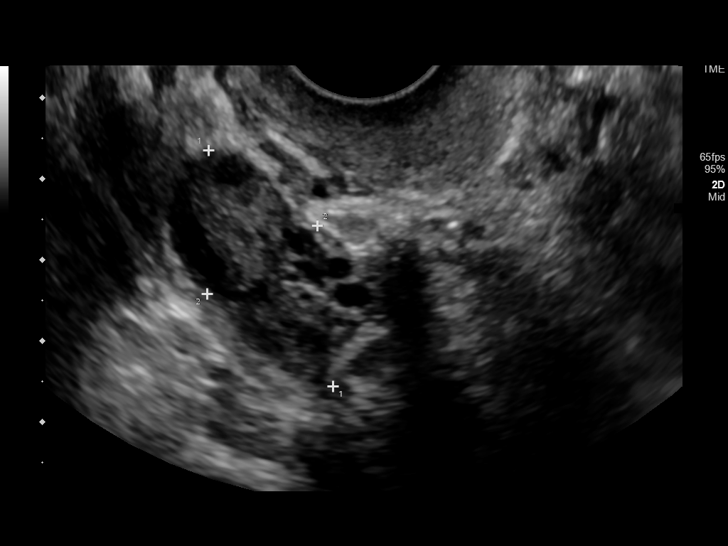
[im 60/66]
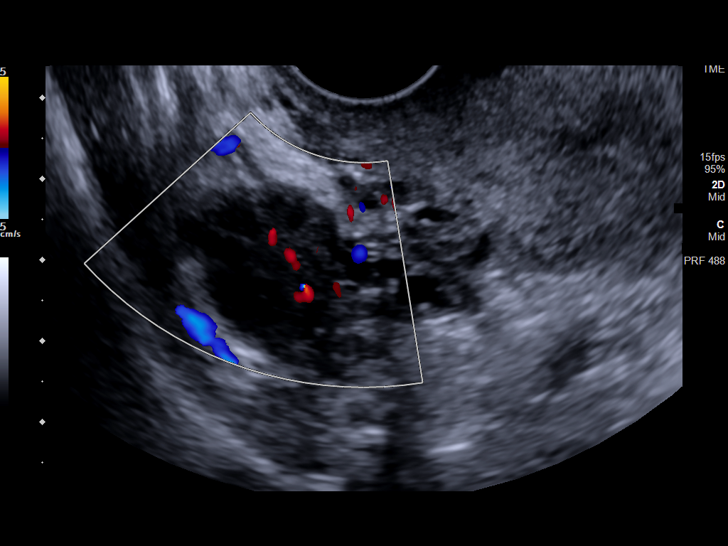
[im 66/66]
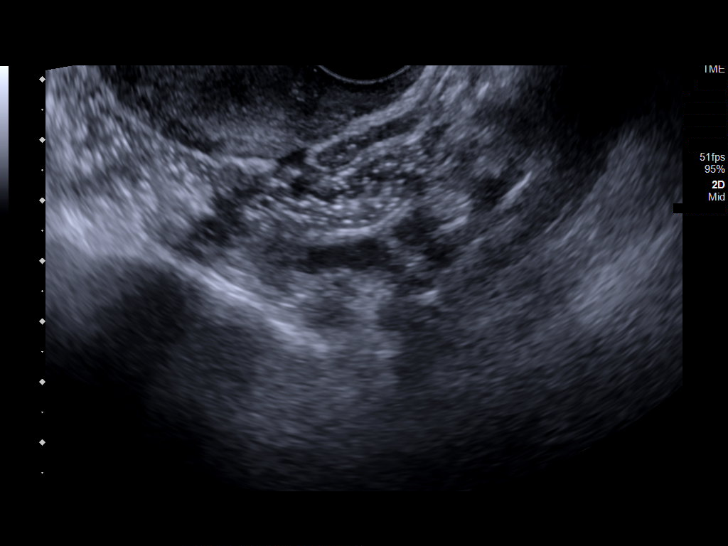

[13 of 25 positions shown; findings below may reference images not displayed]

FINDINGS: Uterus

Measurements: 5.5 x 3.5 x 4.5 cm.  Retroflexed.  No masses.

Endometrium

Thickness: 3.5 mm.  No focal abnormality visualized.

Right ovary

Measurements: 3.3 x 1.6 x 1.7 cm. Normal appearance/no adnexal mass.

Left ovary

Not visualized due to overlapping shadowing bowel gas.

Other findings

No abnormal free fluid.
IMPRESSION: 1. No cause for the patient's bleeding or pain identified. If
bleeding remains unresponsive to hormonal or medical therapy,
sonohysterogram should be considered for focal lesion work-up. (Ref:
Radiological Reasoning: Algorithmic Workup of Abnormal Vaginal
Bleeding with Endovaginal Sonography and Sonohysterography. AJR
3006; 191:S68-73)
2. The left ovary is not visualized.
3. No other abnormalities.

## 2020-01-14 DIAGNOSIS — L0100 Impetigo, unspecified: Secondary | ICD-10-CM | POA: Insufficient documentation

## 2020-08-31 DIAGNOSIS — N946 Dysmenorrhea, unspecified: Secondary | ICD-10-CM | POA: Insufficient documentation

## 2020-08-31 DIAGNOSIS — F988 Other specified behavioral and emotional disorders with onset usually occurring in childhood and adolescence: Secondary | ICD-10-CM | POA: Insufficient documentation

## 2020-08-31 DIAGNOSIS — R41840 Attention and concentration deficit: Secondary | ICD-10-CM | POA: Insufficient documentation

## 2022-04-22 ENCOUNTER — Ambulatory Visit
Admission: EM | Admit: 2022-04-22 | Discharge: 2022-04-22 | Disposition: A | Discharge status: Home / Self Care | Payer: Medicaid Other | Attending: Emergency Medicine | Admitting: Emergency Medicine

## 2022-04-22 ENCOUNTER — Ambulatory Visit (INDEPENDENT_AMBULATORY_CARE_PROVIDER_SITE_OTHER): Payer: Medicaid Other

## 2022-04-22 DIAGNOSIS — J40 Bronchitis, not specified as acute or chronic: Secondary | ICD-10-CM | POA: Diagnosis not present

## 2022-04-22 DIAGNOSIS — R21 Rash and other nonspecific skin eruption: Secondary | ICD-10-CM | POA: Diagnosis not present

## 2022-04-22 DIAGNOSIS — R059 Cough, unspecified: Secondary | ICD-10-CM | POA: Insufficient documentation

## 2022-04-22 DIAGNOSIS — Z20822 Contact with and (suspected) exposure to covid-19: Secondary | ICD-10-CM | POA: Diagnosis not present

## 2022-04-22 DIAGNOSIS — B354 Tinea corporis: Secondary | ICD-10-CM

## 2022-04-22 LAB — SARS CORONAVIRUS 2 BY RT PCR: SARS Coronavirus 2 by RT PCR: NEGATIVE

## 2022-04-22 MED ORDER — TRIAMCINOLONE ACETONIDE 0.1 % EX CREA: 1.0000 | TOPICAL_CREAM | Freq: Two times a day (BID) | CUTANEOUS | 0 refills | Status: DC

## 2022-04-22 MED ORDER — BENZONATATE 100 MG PO CAPS: 200.0000 mg | ORAL_CAPSULE | Freq: Three times a day (TID) | ORAL | 0 refills | Status: DC

## 2022-04-22 MED ORDER — DOXYCYCLINE HYCLATE 100 MG PO CAPS: 100.0000 mg | ORAL_CAPSULE | Freq: Two times a day (BID) | ORAL | 0 refills | Status: DC

## 2022-04-22 MED ORDER — PROMETHAZINE-DM 6.25-15 MG/5ML PO SYRP: 5.0000 mL | ORAL_SOLUTION | Freq: Four times a day (QID) | ORAL | 0 refills | Status: DC | PRN

## 2022-04-22 MED ORDER — TERBINAFINE HCL 250 MG PO TABS: 250.0000 mg | ORAL_TABLET | Freq: Every day | ORAL | 0 refills | Status: AC

## 2022-04-22 NOTE — Discharge Instructions (Addendum)
Take the doxycycline twice daily for 10 days for treatment of bronchitis.  Use the Tessalon Perles every 8 hours for your cough.  Taken with a small sip of water.  They may give you some numbness to the base of your tongue or metallic taste in her mouth, this is normal.  They are designed to calm down the cough reflex.  Use the Promethazine DM cough syrup at bedtime as will make you drowsy.  You may take 1 teaspoon (5 mL) every 6 hours.  For your ringworm take the terbinafine once daily for 14 days.  You can apply the triamcinolone ointment to the rash twice daily as needed for itching.  You may also use over-the-counter Allegra, Claritin, or Zyrtec during the day as needed for itching and use Benadryl at bedtime.  Return for reevaluation for new or worsening symptoms.

## 2022-04-22 NOTE — ED Triage Notes (Addendum)
Pt concerns for ringworm x4 months reports spot on LT upper arm, bumps on abdomen she noticed today  Also c/o cough x3 weeks, pt states mixture of dry & productive cough. Temp last night 100.1

## 2022-04-22 NOTE — ED Provider Notes (Signed)
[facility]    CSN: [id number] Arrival date & time: 04/22/22  1508      History   Chief Complaint Chief Complaint  Patient presents with   Rash   Cough    HPI Kendra Cole is a 31 y.o. female.   HPI  31 year old female here for evaluation of skin rash and respiratory complaint.  Patient reports that she has been experiencing a rash that is itchy on her body for the last 4 months.  She states that it started off with a large patch on her left inner thigh that she noticed first and treated with jock itch cream.  It took about a month and then it finally resolved.  Now she has other lesions on her abdomen, both thighs, and left upper arm.  In the last day or so she has noticed red bumps developing on her abdomen arms, and chest.  These all itch.  Patient does have animals in the house but states that none of them have fleas and she keeps them on flea and tick medication. Patient's other complaint is that she has been experiencing a cough for the past few weeks that is a mixture both dry and productive.  She developed a fever last night with a Tmax of 100.1.  She states her cough is productive for a yellow-green sputum.  She denies any runny nose or nasal congestion.  Past Medical History:  Diagnosis Date   Anxiety    Asthma    History of stomach ulcers     There are no problems to display for this patient.   Past Surgical History:  Procedure Laterality Date   NO PAST SURGERIES      OB History     Gravida  1   Para      Term      Preterm      AB      Living         SAB      IAB      Ectopic      Multiple      Live Births  1            Home Medications    Prior to Admission medications   Medication Sig Start Date End Date Taking? Authorizing Provider  benzonatate (TESSALON) 100 MG capsule Take 2 capsules (200 mg total) by mouth every 8 (eight) hours. 04/22/22  Yes Becky Augusta, NP  doxycycline (VIBRAMYCIN) 100 MG capsule Take 1  capsule (100 mg total) by mouth 2 (two) times daily. 04/22/22  Yes Becky Augusta, NP  promethazine-dextromethorphan (PROMETHAZINE-DM) 6.25-15 MG/5ML syrup Take 5 mLs by mouth 4 (four) times daily as needed. 04/22/22  Yes Becky Augusta, NP  terbinafine (LAMISIL) 250 MG tablet Take 1 tablet (250 mg total) by mouth daily for 14 days. 04/22/22 05/06/22 Yes Becky Augusta, NP  triamcinolone cream (KENALOG) 0.1 % Apply 1 Application topically 2 (two) times daily. 04/22/22  Yes Becky Augusta, NP  cyclobenzaprine (FLEXERIL) 5 MG tablet 1 tablet 3 times daily as needed for muscle pain/spasm. 11/29/17   Tommie Sams, DO  diclofenac (VOLTAREN) 75 MG EC tablet Take 1 tablet (75 mg total) by mouth 2 (two) times daily as needed. 11/29/17   Cook, Jayce G, DO  ISIBLOOM 0.15-30 MG-MCG tablet TK 1 T PO D FOR MENSTRUAL BLEEDING 11/20/17   [provider]    Family History History reviewed. No pertinent family history.  Social History Social History  Tobacco Use   Smoking status: Every Day    Packs/day: 0.50    Types: Cigarettes   Smokeless tobacco: Never  Vaping Use   Vaping Use: Never used  Substance Use Topics   Alcohol use: Not Currently   Drug use: No     Allergies   Patient has no known allergies.   Review of Systems Review of Systems  Constitutional:  Negative for fever.  HENT:  Negative for congestion and rhinorrhea.   Respiratory:  Positive for cough.   Skin:  Positive for rash.  Hematological: Negative.   Psychiatric/Behavioral: Negative.       Physical Exam Triage Vital Signs ED Triage Vitals  Enc Vitals Group     BP 04/22/22 1528 (!) 120/91     Pulse Rate 04/22/22 1528 93     Resp --      Temp 04/22/22 1528 98.3 F (36.8 C)     Temp Source 04/22/22 1528 Oral     SpO2 04/22/22 1528 100 %     Weight 04/22/22 1526 140 lb (63.5 kg)     Height 04/22/22 1526 5\' 2"  (1.575 m)     Head Circumference --      Peak Flow --      Pain Score 04/22/22 1526 0     Pain Loc --      Pain  Edu? --      Excl. in GC? --    No data found.  Updated Vital Signs BP (!) 120/91 (BP Location: Left Arm)   Pulse 93   Temp 98.3 F (36.8 C) (Oral)   Ht 5\' 2"  (1.575 m)   Wt 140 lb (63.5 kg)   LMP 04/10/2022 (Exact Date)   SpO2 100%   BMI 25.61 kg/m   Visual Acuity Right Eye Distance:   Left Eye Distance:   Bilateral Distance:    Right Eye Near:   Left Eye Near:    Bilateral Near:     Physical Exam Vitals and nursing note reviewed.  Constitutional:      Appearance: Normal appearance. She is not ill-appearing.  HENT:     Head: Normocephalic and atraumatic.     Right Ear: Tympanic membrane, ear canal and external ear normal. There is no impacted cerumen.     Left Ear: Tympanic membrane, ear canal and external ear normal. There is no impacted cerumen.     Nose: Nose normal. No congestion or rhinorrhea.     Mouth/Throat:     Mouth: Mucous membranes are moist.     Pharynx: Oropharynx is clear. No oropharyngeal exudate or posterior oropharyngeal erythema.  Cardiovascular:     Rate and Rhythm: Normal rate and regular rhythm.     Pulses: Normal pulses.     Heart sounds: Normal heart sounds. No murmur heard.    No friction rub. No gallop.  Pulmonary:     Effort: Pulmonary effort is normal.     Breath sounds: Normal breath sounds. No wheezing, rhonchi or rales.  Skin:    General: Skin is warm and dry.     Capillary Refill: Capillary refill takes less than 2 seconds.     Findings: Erythema and rash present.  Neurological:     General: No focal deficit present.     Mental Status: She is alert and oriented to person, place, and time.  Psychiatric:        Mood and Affect: Mood normal.        Behavior: Behavior normal.  Thought Content: Thought content normal.        Judgment: Judgment normal.      UC Treatments / Results  Labs (all labs ordered are listed, but only abnormal results are displayed) Labs Reviewed  SARS CORONAVIRUS 2 BY RT PCR     EKG   Radiology DG Chest 2 View  Result Date: 04/22/2022 CLINICAL DATA:  Cough x3 weeks EXAM: CHEST - 2 VIEW COMPARISON:  None Available. FINDINGS: The heart size and mediastinal contours are within normal limits. Both lungs are clear. The visualized skeletal structures are unremarkable. IMPRESSION: No active cardiopulmonary disease. Electronically Signed   By: Ernie Avena M.D.   On: 04/22/2022 15:56    Procedures Procedures (including critical care time)  Medications Ordered in UC Medications - No data to display  Initial Impression / Assessment and Plan / UC Course  I have reviewed the triage vital signs and the nursing notes.  Pertinent labs & imaging results that were available during my care of the patient were reviewed by me and considered in my medical decision making (see chart for details).   Patient is here for evaluation of a mixture of symptoms to include a rash that is been present for 4 months and is spreading and a cough that is been present for 3 weeks.  The rash started on the patient's right inner thigh and she states that it resolved after a month of using jock itch cream.  Now she is having of the lesions appear on her lower extremities, abdomen, and left arm.  She also today noticed erythematous red rash that is itchy appearing across her abdomen and chest.  Also on both arms.            There is a circular lesion that is raised with the clear annular center on the patient's abdomen that does resemble ringworm.  The lesion on the patient's left upper arm is blistered and scabbed.  Patient reports that she put a Band-Aid over it with ointment overnight and left it on.  The remainder of the rash looks more like insect bites.  Patient has animals but she denies any of them have fleas as she keeps them on flea and tick medication.  There is no pustules or vesicles.  The rash is diffuse.  I will treat the patient with oral antifungals for the ringworm.  I  will also prescribe topical triamcinolone that she can apply to the rash twice daily as needed for itching in addition to use over-the-counter Zyrtec, Claritin, Allegra during the day and Benadryl at night. Patient's respiratory symptoms have been present for the past 3 weeks and they involve of cough that is intermittently productive for sputum.  Patient describes the sputum as a light green to yellow.  She did have a fever last night but has not had a fever today or throughout the rest of the course of her illness.  She denies any upper respiratory symptoms.  On exam patient's pearly-gray tympanic membranes bilaterally with normal reflex and clear external auditory canals.  Nasal mucosa is pink and moist without erythema, edema, or discharge.  Oropharyngeal exam is benign.  Cardiopulmonary exam reveals S1-S2 heart sounds with regular rate and rhythm and lung sounds that are clear to auscultation all fields.  Given the fact that patient has been experiencing a cough for 3 weeks I will obtain chest x-ray to look for acute cardiopulmonary abnormality.  Chest x-ray findings by radiology state no active cardiopulmonary disease.  COVID  PCR is negative.  I will discharge patient home with a diagnosis of ringworm, nonspecific skin eruption, bronchitis.  I will put her on doxycycline twice daily for 10 days for treatment of the bronchitis since its been going on for 3 weeks.  I will treat her ringworm with terbinafine 250 mg daily for 14 days, and I will prescribe triamcinolone cream that she can apply to her rash twice daily as needed for itching.  She can also use over-the-counter antihistamine such as Claritin, Zyrtec, or Allegra.    Final Clinical Impressions(s) / UC Diagnoses   Final diagnoses:  Bronchitis  Rash and nonspecific skin eruption  Ringworm of body     Discharge Instructions      Take the doxycycline twice daily for 10 days for treatment of bronchitis.  Use the Tessalon Perles every  8 hours for your cough.  Taken with a small sip of water.  They may give you some numbness to the base of your tongue or metallic taste in her mouth, this is normal.  They are designed to calm down the cough reflex.  Use the Promethazine DM cough syrup at bedtime as will make you drowsy.  You may take 1 teaspoon (5 mL) every 6 hours.  For your ringworm take the terbinafine once daily for 14 days.  You can apply the triamcinolone ointment to the rash twice daily as needed for itching.  You may also use over-the-counter Allegra, Claritin, or Zyrtec during the day as needed for itching and use Benadryl at bedtime.  Return for reevaluation for new or worsening symptoms.      ED Prescriptions     Medication Sig Dispense Auth. Provider   benzonatate (TESSALON) 100 MG capsule Take 2 capsules (200 mg total) by mouth every 8 (eight) hours. 21 capsule Margarette Canada, NP   doxycycline (VIBRAMYCIN) 100 MG capsule Take 1 capsule (100 mg total) by mouth 2 (two) times daily. 20 capsule Margarette Canada, NP   promethazine-dextromethorphan (PROMETHAZINE-DM) 6.25-15 MG/5ML syrup Take 5 mLs by mouth 4 (four) times daily as needed. 118 mL Margarette Canada, NP   terbinafine (LAMISIL) 250 MG tablet Take 1 tablet (250 mg total) by mouth daily for 14 days. 14 tablet Margarette Canada, NP   triamcinolone cream (KENALOG) 0.1 % Apply 1 Application topically 2 (two) times daily. 30 g Margarette Canada, NP      PDMP not reviewed this encounter.   Margarette Canada, NP 04/22/22 1623

## 2022-06-15 ENCOUNTER — Ambulatory Visit
Admission: EM | Admit: 2022-06-15 | Discharge: 2022-06-15 | Payer: Medicaid Other | Attending: Physician Assistant | Admitting: Physician Assistant

## 2022-06-15 DIAGNOSIS — R1031 Right lower quadrant pain: Secondary | ICD-10-CM | POA: Diagnosis present

## 2022-06-15 LAB — URINALYSIS, ROUTINE W REFLEX MICROSCOPIC
Bilirubin Urine: NEGATIVE
Glucose, UA: NEGATIVE mg/dL
Hgb urine dipstick: NEGATIVE
Ketones, ur: NEGATIVE mg/dL
Leukocytes,Ua: NEGATIVE
Nitrite: NEGATIVE
Protein, ur: NEGATIVE mg/dL
Specific Gravity, Urine: 1.025 (ref 1.005–1.030)
pH: 6 (ref 5.0–8.0)

## 2022-06-15 LAB — PREGNANCY, URINE: Preg Test, Ur: NEGATIVE

## 2022-06-15 NOTE — ED Notes (Signed)
Patient is being discharged from the Urgent Care and sent to the Emergency Department via pov . Per Nettie Elm PA, patient is in need of higher level of care due to severe abdominal pain. Patient is aware and verbalizes understanding of plan of care.  Vitals:   06/15/22 1455  BP: (!) 123/94  Pulse: 95  Temp: 98.5 F (36.9 C)  SpO2: 99%

## 2022-06-15 NOTE — ED Triage Notes (Signed)
Patient reports right lower abdominal pain -- constant pain -- started yesterday -- worse today.   Patient reports it hurts to stand up straight.   Patient denies any urinary symptoms.

## 2022-06-15 NOTE — Discharge Instructions (Signed)
Go to the ER right now to have further work up.

## 2022-06-15 NOTE — ED Provider Notes (Signed)
[facility]    CSN: [id number] Arrival date & time: 06/15/22  1447      History   Chief Complaint Chief Complaint  Patient presents with   Abdominal Pain    HPI Kendra Cole is a 31 y.o. female who presents with onset of RLQ pain since yesterday and is worse today. Pain worse to stand up straight. She denies any UTI symptoms. Has not had any appetite since this started yesterday. Denies constipation or vomiting. Has gotten nauseous in the am's, but this is not new. Denies hx of ovarian cysts.    Past Medical History:  Diagnosis Date   Anxiety    Asthma    History of stomach ulcers     There are no problems to display for this patient.   Past Surgical History:  Procedure Laterality Date   NO PAST SURGERIES      OB History     Gravida  1   Para      Term      Preterm      AB      Living         SAB      IAB      Ectopic      Multiple      Live Births  1            Home Medications    Prior to Admission medications   Medication Sig Start Date End Date Taking? Authorizing Provider  benzonatate (TESSALON) 100 MG capsule Take 2 capsules (200 mg total) by mouth every 8 (eight) hours. 04/22/22   Margarette Canada, NP  cyclobenzaprine (FLEXERIL) 5 MG tablet 1 tablet 3 times daily as needed for muscle pain/spasm. 11/29/17   Coral Spikes, DO  diclofenac (VOLTAREN) 75 MG EC tablet Take 1 tablet (75 mg total) by mouth 2 (two) times daily as needed. 11/29/17   Cook, Jayce G, DO  ISIBLOOM 0.15-30 MG-MCG tablet TK 1 T PO D FOR MENSTRUAL BLEEDING 11/20/17   [provider]  promethazine-dextromethorphan (PROMETHAZINE-DM) 6.25-15 MG/5ML syrup Take 5 mLs by mouth 4 (four) times daily as needed. 04/22/22   Margarette Canada, NP  triamcinolone cream (KENALOG) 0.1 % Apply 1 Application topically 2 (two) times daily. 04/22/22   Margarette Canada, NP    Family History History reviewed. No pertinent family history.  Social History Social History   Tobacco  Use   Smoking status: Every Day    Packs/day: 0.50    Types: Cigarettes   Smokeless tobacco: Never  Vaping Use   Vaping Use: Never used  Substance Use Topics   Alcohol use: Not Currently   Drug use: No     Allergies   Patient has no known allergies.   Review of Systems Review of Systems  Constitutional:  Positive for appetite change. Negative for chills, diaphoresis and fever.  Gastrointestinal:  Positive for abdominal pain and nausea. Negative for constipation, diarrhea and vomiting.  Skin:  Negative for rash.  Hematological:  Negative for adenopathy.     Physical Exam Triage Vital Signs ED Triage Vitals  Enc Vitals Group     BP 06/15/22 1455 (!) 123/94     Pulse Rate 06/15/22 1455 95     Resp --      Temp 06/15/22 1455 98.5 F (36.9 C)     Temp Source 06/15/22 1455 Oral     SpO2 06/15/22 1455 99 %     Weight 06/15/22 1454 141 lb (  64 kg)     Height 06/15/22 1454 5\' 2"  (1.575 m)     Head Circumference --      Peak Flow --      Pain Score 06/15/22 1453 10     Pain Loc --      Pain Edu? --      Excl. in [place]? --    No data found.  Updated Vital Signs BP (!) 123/94 (BP Location: Left Arm)   Pulse 95   Temp 98.5 F (36.9 C) (Oral)   Ht 5\' 2"  (1.575 m)   Wt 141 lb (64 kg)   LMP 05/25/2022   SpO2 99%   BMI 25.79 kg/m   Visual Acuity Right Eye Distance:   Left Eye Distance:   Bilateral Distance:    Right Eye Near:   Left Eye Near:    Bilateral Near:     Physical Exam Vitals and nursing note reviewed.  Constitutional:      General: She is in acute distress.     Appearance: She is not ill-appearing, toxic-appearing or diaphoretic.     Comments: She is in a lot of pain specially when she moves  Eyes:     General: No scleral icterus.    Extraocular Movements: Extraocular movements intact.  Pulmonary:     Effort: Pulmonary effort is normal.  Abdominal:     General: Abdomen is flat. Bowel sounds are absent.     Palpations: Abdomen is soft.      Tenderness: There is abdominal tenderness in the right lower quadrant. There is guarding. There is no rebound. Positive signs include psoas sign.     Hernia: No hernia is present.  Skin:    General: Skin is warm and dry.  Neurological:     Mental Status: She is alert and oriented to person, place, and time.  Psychiatric:        Mood and Affect: Mood normal.        Behavior: Behavior normal.      UC Treatments / Results  Labs (all labs ordered are listed, but only abnormal results are displayed) Labs Reviewed  URINALYSIS, ROUTINE W REFLEX MICROSCOPIC  PREGNANCY, URINE  UA and pregnancy test are negative  EKG   Radiology No results found.  Procedures Procedures (including critical care time)  Medications Ordered in UC Medications - No data to display  Initial Impression / Assessment and Plan / UC Course  I have reviewed the triage vital signs and the nursing notes.  Pertinent labs  results that were available during my care of the patient were reviewed by me and considered in my medical decision making (see chart for details).  Acute RLQ pain    Pt was given the option to start work up here, but if + for appendicitis or ovarian cyst, she may still have to go to ER. She opted to just go to ER and have the work up there.    Final Clinical Impressions(s) / UC Diagnoses   Acute RLQ pain     Discharge Instructions      Go to the ER right now to have further work up.      ED Prescriptions   None    PDMP not reviewed this encounter.   , PA-C 06/15/22 1518

## 2022-06-26 DIAGNOSIS — M25572 Pain in left ankle and joints of left foot: Secondary | ICD-10-CM | POA: Insufficient documentation

## 2022-06-26 DIAGNOSIS — Z3201 Encounter for pregnancy test, result positive: Secondary | ICD-10-CM | POA: Insufficient documentation

## 2022-07-25 DIAGNOSIS — Z3490 Encounter for supervision of normal pregnancy, unspecified, unspecified trimester: Secondary | ICD-10-CM | POA: Insufficient documentation

## 2022-07-26 LAB — OB RESULTS CONSOLE HEPATITIS B SURFACE ANTIGEN: Hepatitis B Surface Ag: NEGATIVE

## 2022-07-26 LAB — OB RESULTS CONSOLE VARICELLA ZOSTER ANTIBODY, IGG: Varicella: IMMUNE

## 2022-07-26 LAB — OB RESULTS CONSOLE RUBELLA ANTIBODY, IGM: Rubella: IMMUNE

## 2022-07-31 NOTE — L&D Delivery Note (Signed)
Delivery Note  Date of delivery: 02/17/2023 Estimated Date of Delivery: 02/20/23 Patient's last menstrual period was 05/25/2022. EGA: [redacted]w[redacted]d  Delivery Note At 2:18 PM a viable female was delivered via Vaginal, Spontaneous (Presentation: Left Occiput Anterior).  APGAR: 8, 9; weight pending. Placenta status: Spontaneous, Intact.  Cord: 3 vessels with the following complications: none.    First Stage: Labor onset: unknown Augmentation : Pitocin Analgesia /Anesthesia intrapartum: Epidural AROM at 1029  Kendra Cole presented to L&D for elective IOL. She was given 1 dose of cytotec and augmented with pitocin. Epidural placed for pain relief.   Second Stage: Complete dilation at 1400 Onset of pushing at 1401 FHR second stage Cat I Delivery at 1418 on 02/17/2023  She progressed to complete and had a spontaneous vaginal birth of a live female over an intact perineum. The fetal head was delivered in OA position with restitution to LOA. No nuchal cord. Anterior then posterior shoulders delivered spontaneously with minimal assistance. Baby placed on mom's abdomen and attended to by transition RN. Cord clamped and cut after 1+ min by FOB. Cord blood obtained for newborn labs.  Third Stage: Placenta delivered intact with 3VC at 1425 Placenta disposition: routine disposal Uterine tone firm / bleeding min IV pitocin given for hemorrhage prophylaxis  Anesthesia: Epidural Episiotomy: None Lacerations: Perineal Suture Repair: none Est. Blood Loss (mL): 100  Complications: none  Mom to postpartum.  Baby to Couplet care / Skin to Skin.  Newborn: Birth Weight: pending  Apgar Scores: 8, 9 Feeding planned: Breastfeeding   [name], CNM 02/17/2023 2:52 PM

## 2022-08-18 ENCOUNTER — Ambulatory Visit
Admission: EM | Admit: 2022-08-18 | Discharge: 2022-08-18 | Disposition: A | Discharge status: Home / Self Care | Payer: Medicaid Other | Attending: Physician Assistant | Admitting: Physician Assistant

## 2022-08-18 ENCOUNTER — Encounter: Payer: Self-pay | Admitting: Emergency Medicine

## 2022-08-18 DIAGNOSIS — Z1152 Encounter for screening for COVID-19: Secondary | ICD-10-CM | POA: Insufficient documentation

## 2022-08-18 DIAGNOSIS — R519 Headache, unspecified: Secondary | ICD-10-CM | POA: Insufficient documentation

## 2022-08-18 DIAGNOSIS — J45909 Unspecified asthma, uncomplicated: Secondary | ICD-10-CM | POA: Diagnosis not present

## 2022-08-18 DIAGNOSIS — J069 Acute upper respiratory infection, unspecified: Secondary | ICD-10-CM | POA: Insufficient documentation

## 2022-08-18 LAB — GROUP A STREP BY PCR: Group A Strep by PCR: NOT DETECTED

## 2022-08-18 LAB — RESP PANEL BY RT-PCR (RSV, FLU A&B, COVID)  RVPGX2
Influenza A by PCR: NEGATIVE
Influenza B by PCR: NEGATIVE
Resp Syncytial Virus by PCR: NEGATIVE
SARS Coronavirus 2 by RT PCR: NEGATIVE

## 2022-08-18 MED ORDER — ALBUTEROL SULFATE HFA 108 (90 BASE) MCG/ACT IN AERS: 2.0000 | INHALATION_SPRAY | RESPIRATORY_TRACT | 0 refills | Status: AC | PRN

## 2022-08-18 NOTE — ED Triage Notes (Signed)
Patient c/o sore throat, headache, cough, fatigue and chills that started yesterday.  Patient unsure of fevers.

## 2022-08-18 NOTE — ED Provider Notes (Signed)
[facility]    CSN: [id number] Arrival date & time: 08/18/22  1200      History   Chief Complaint Chief Complaint  Patient presents with   Headache   Cough    HPI Kendra Cole is a 32 y.o. female.   Patient presents for evaluation of chills, nasal congestion, rhinorrhea, sore throat, cough, shortness of breath and intermittent generalized headaches beginning 1 day ago.  Known sick contacts in household.  History of asthma.  Has not attempted treatment.  Denies wheezing.  Vomiting is at baseline.  Currently [redacted] weeks pregnant.   Past Medical History:  Diagnosis Date   Anxiety    Asthma    History of stomach ulcers     There are no problems to display for this patient.   Past Surgical History:  Procedure Laterality Date   NO PAST SURGERIES      OB History     Gravida  2   Para      Term      Preterm      AB      Living         SAB      IAB      Ectopic      Multiple      Live Births  1            Home Medications    Prior to Admission medications   Medication Sig Start Date End Date Taking? Authorizing Provider  ondansetron (ZOFRAN-ODT) 4 MG disintegrating tablet Take by mouth. 08/16/22 08/23/22 Yes [provider]  benzonatate (TESSALON) 100 MG capsule Take 2 capsules (200 mg total) by mouth every 8 (eight) hours. 04/22/22   Margarette Canada, NP  cyclobenzaprine (FLEXERIL) 5 MG tablet 1 tablet 3 times daily as needed for muscle pain/spasm. 11/29/17   Coral Spikes, DO  diclofenac (VOLTAREN) 75 MG EC tablet Take 1 tablet (75 mg total) by mouth 2 (two) times daily as needed. 11/29/17   Cook, Jayce G, DO  ISIBLOOM 0.15-30 MG-MCG tablet TK 1 T PO D FOR MENSTRUAL BLEEDING 11/20/17   [provider]  promethazine-dextromethorphan (PROMETHAZINE-DM) 6.25-15 MG/5ML syrup Take 5 mLs by mouth 4 (four) times daily as needed. 04/22/22   Margarette Canada, NP  triamcinolone cream (KENALOG) 0.1 % Apply 1 Application topically 2 (two) times  daily. 04/22/22   Margarette Canada, NP    Family History History reviewed. No pertinent family history.  Social History Social History   Tobacco Use   Smoking status: Former    Packs/day: 0.50    Types: Cigarettes   Smokeless tobacco: Never  Vaping Use   Vaping Use: Never used  Substance Use Topics   Alcohol use: Not Currently   Drug use: No     Allergies   Patient has no known allergies.   Review of Systems Review of Systems  Constitutional:  Positive for chills. Negative for activity change, appetite change, diaphoresis, fatigue, fever and unexpected weight change.  HENT:  Positive for congestion, postnasal drip and sore throat. Negative for dental problem, drooling, ear discharge, ear pain, facial swelling, hearing loss, mouth sores, nosebleeds, rhinorrhea, sinus pressure, sinus pain, sneezing, tinnitus, trouble swallowing and voice change.   Respiratory:  Positive for cough and shortness of breath. Negative for apnea, choking, chest tightness, wheezing and stridor.   Cardiovascular: Negative.   Gastrointestinal: Negative.   Neurological:  Positive for headaches.     Physical Exam Triage Vital Signs ED  Triage Vitals  Enc Vitals Group     BP 08/18/22 1300 112/76     Pulse Rate 08/18/22 1300 98     Resp --      Temp 08/18/22 1300 97.9 F (36.6 C)     Temp Source 08/18/22 1300 Oral     SpO2 08/18/22 1300 98 %     Weight --      Height 08/18/22 1258 5\' 2"  (1.575 m)     Head Circumference --      Peak Flow --      Pain Score 08/18/22 1258 0     Pain Loc --      Pain Edu? --      Excl. in [place]? --    No data found.  Updated Vital Signs BP 112/76 (BP Location: Left Arm)   Pulse 98   Temp 97.9 F (36.6 C) (Oral)   Ht 5\' 2"  (1.575 m)   LMP 05/25/2022   SpO2 98%   BMI 25.79 kg/m   Visual Acuity Right Eye Distance:   Left Eye Distance:   Bilateral Distance:    Right Eye Near:   Left Eye Near:    Bilateral Near:     Physical Exam Constitutional:       Appearance: Normal appearance.  HENT:     Head: Normocephalic.     Right Ear: Tympanic membrane, ear canal and external ear normal.     Left Ear: Tympanic membrane, ear canal and external ear normal.     Nose: Congestion and rhinorrhea present.     Mouth/Throat:     Mouth: Mucous membranes are moist.     Pharynx: Posterior oropharyngeal erythema present.  Cardiovascular:     Rate and Rhythm: Normal rate and regular rhythm.     Pulses: Normal pulses.     Heart sounds: Normal heart sounds.  Pulmonary:     Effort: Pulmonary effort is normal.     Breath sounds: Normal breath sounds.  Neurological:     Mental Status: She is alert.      UC Treatments / Results  Labs (all labs ordered are listed, but only abnormal results are displayed) Labs Reviewed  GROUP A STREP BY PCR  RESP PANEL BY RT-PCR (RSV, FLU A&B, COVID)  [id number]    EKG   Radiology No results found.  Procedures Procedures (including critical care time)  Medications Ordered in UC Medications - No data to display  Initial Impression / Assessment and Plan / UC Course  I have reviewed the triage vital signs and the nursing notes.  Pertinent labs & imaging results that were available during my care of the patient were reviewed by me and considered in my medical decision making (see chart for details).  Viral URI with cough  Patient is in no signs of distress nor toxic appearing.  Vital signs are stable.  Low suspicion for pneumonia, pneumothorax or bronchitis and therefore will defer imaging.  COVID, flu, RSV and strep testing negative.  Prescribed albuterol and given over-the-counter's safe medication list for during pregnancy.May use additional over-the-counter medications as needed for supportive care.  May follow-up with urgent care as needed if symptoms persist or worsen.  Final Clinical Impressions(s) / UC Diagnoses   Final diagnoses:  None   Discharge Instructions   None    ED Prescriptions   None     PDMP not reviewed this encounter.   Hans Eden, NP 08/18/22 1408

## 2022-08-18 NOTE — Discharge Instructions (Addendum)
Your symptoms today are most likely being caused by a virus and should steadily improve in time it can take up to 7 to 10 days before you truly start to see a turnaround however things will get better  COVID, flu, RSV and strep testing negative     may  take 2 puffs of albuterol inhaler every 4-6 hours as needed for management of shortness of breath  Inside of your packet is a list of medications that are safe for you to use during pregnancy

## 2022-12-05 ENCOUNTER — Other Ambulatory Visit: Payer: Self-pay | Admitting: Obstetrics and Gynecology

## 2023-01-25 LAB — OB RESULTS CONSOLE RPR: RPR: NONREACTIVE

## 2023-01-25 LAB — OB RESULTS CONSOLE GC/CHLAMYDIA
Chlamydia: NEGATIVE
Neisseria Gonorrhea: NEGATIVE

## 2023-01-25 LAB — OB RESULTS CONSOLE HIV ANTIBODY (ROUTINE TESTING): HIV: NONREACTIVE

## 2023-02-03 ENCOUNTER — Observation Stay
Admission: EM | Admit: 2023-02-03 | Discharge: 2023-02-03 | Disposition: A | Discharge status: Home / Self Care | Payer: Medicaid Other | Attending: Obstetrics and Gynecology | Admitting: Obstetrics and Gynecology

## 2023-02-03 ENCOUNTER — Other Ambulatory Visit: Payer: Self-pay

## 2023-02-03 ENCOUNTER — Encounter: Payer: Self-pay | Admitting: Obstetrics and Gynecology

## 2023-02-03 DIAGNOSIS — Z79899 Other long term (current) drug therapy: Secondary | ICD-10-CM | POA: Insufficient documentation

## 2023-02-03 DIAGNOSIS — Z87891 Personal history of nicotine dependence: Secondary | ICD-10-CM | POA: Insufficient documentation

## 2023-02-03 DIAGNOSIS — J45909 Unspecified asthma, uncomplicated: Secondary | ICD-10-CM | POA: Insufficient documentation

## 2023-02-03 DIAGNOSIS — O99513 Diseases of the respiratory system complicating pregnancy, third trimester: Secondary | ICD-10-CM | POA: Insufficient documentation

## 2023-02-03 DIAGNOSIS — Z3A37 37 weeks gestation of pregnancy: Secondary | ICD-10-CM | POA: Diagnosis not present

## 2023-02-03 DIAGNOSIS — O1203 Gestational edema, third trimester: Secondary | ICD-10-CM | POA: Diagnosis present

## 2023-02-03 LAB — URINALYSIS, COMPLETE (UACMP) WITH MICROSCOPIC
Bilirubin Urine: NEGATIVE
Glucose, UA: NEGATIVE mg/dL
Hgb urine dipstick: NEGATIVE
Ketones, ur: NEGATIVE mg/dL
Nitrite: NEGATIVE
Protein, ur: NEGATIVE mg/dL
Specific Gravity, Urine: 1.004 — ABNORMAL LOW (ref 1.005–1.030)
pH: 6 (ref 5.0–8.0)

## 2023-02-03 LAB — WET PREP, GENITAL
Clue Cells Wet Prep HPF POC: NONE SEEN
Sperm: NONE SEEN
Trich, Wet Prep: NONE SEEN
WBC, Wet Prep HPF POC: <10 — AB (ref <10)
Yeast Wet Prep HPF POC: NONE SEEN

## 2023-02-03 LAB — RUPTURE OF MEMBRANE (ROM)PLUS: Rom Plus: NEGATIVE

## 2023-02-03 NOTE — Progress Notes (Signed)
Discharge instructions provided to patient. Patient verbalized understanding. Pt educated on signs and symptoms of labor, vaginal bleeding, LOF, and fetal movement. Red flag signs reviewed by Gauge Winski RN. Patient discharged home with significant other in stable condition. 

## 2023-02-03 NOTE — OB Triage Note (Signed)
Notified Provider via telephone F. Rubye Oaks, CNM of patients arrival and SBAR given. New orders given for Ted hose, wet prep, ROM+ and SVE (cervical). See orders. Will notify provider of results.

## 2023-02-03 NOTE — OB Triage Note (Addendum)
Pt arrived to unit wheeled by ED staff with complaints of Pedal swelling. Patient is G2P1 [redacted]w[redacted]d. Patient denies symptoms consistent with active vaginal bleeding or ROM however patient states vaginal fluid is "sweet smelling".  Patient reports active fetal movement. Patient placed on EFM and Toco to non tender area of abdomen. Patient medical history reviewed and consents for treatment signed. Reviewed visitation policy and oriented patient to care area. Will notify provider of patient arrival.
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
## 2023-02-14 ENCOUNTER — Encounter: Payer: Self-pay | Admitting: Obstetrics and Gynecology

## 2023-02-14 ENCOUNTER — Observation Stay
Admission: EM | Admit: 2023-02-14 | Discharge: 2023-02-14 | Disposition: A | Discharge status: Home / Self Care | Payer: Medicaid Other | Attending: Obstetrics and Gynecology | Admitting: Obstetrics and Gynecology

## 2023-02-14 ENCOUNTER — Other Ambulatory Visit: Payer: Self-pay

## 2023-02-14 ENCOUNTER — Other Ambulatory Visit: Payer: Self-pay | Admitting: Obstetrics and Gynecology

## 2023-02-14 DIAGNOSIS — Z3A39 39 weeks gestation of pregnancy: Secondary | ICD-10-CM | POA: Diagnosis not present

## 2023-02-14 DIAGNOSIS — O23593 Infection of other part of genital tract in pregnancy, third trimester: Secondary | ICD-10-CM | POA: Diagnosis not present

## 2023-02-14 DIAGNOSIS — Z3490 Encounter for supervision of normal pregnancy, unspecified, unspecified trimester: Secondary | ICD-10-CM

## 2023-02-14 DIAGNOSIS — O368190 Decreased fetal movements, unspecified trimester, not applicable or unspecified: Principal | ICD-10-CM | POA: Diagnosis present

## 2023-02-14 DIAGNOSIS — O368130 Decreased fetal movements, third trimester, not applicable or unspecified: Principal | ICD-10-CM | POA: Insufficient documentation

## 2023-02-14 LAB — WET PREP, GENITAL
Sperm: NONE SEEN
Trich, Wet Prep: NONE SEEN
WBC, Wet Prep HPF POC: >=10 — AB (ref <10)
Yeast Wet Prep HPF POC: NONE SEEN

## 2023-02-14 MED ORDER — METRONIDAZOLE 500 MG PO TABS: 500.0000 mg | ORAL_TABLET | Freq: Two times a day (BID) | ORAL | 0 refills | Status: DC

## 2023-02-14 NOTE — OB Triage Note (Signed)
Pt Kendra Cole 32 y.o. presents to labor and delivery triage reporting decreased fetal movement Dr. Feliberto Gottron recommended that she come to L&D for an NST. Pt is a G2P1 at [redacted]w[redacted]d . Pt denies signs and symptoms consistent with rupture of membranes or active vaginal bleeding. Pt states positive fetal movement. She is having irregular contractions. External FM and TOCO applied to non-tender abdomen and assessing. Initial FHR . Vital signs obtained and within normal limits. Provider notified of pt.
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
## 2023-02-14 NOTE — OB Triage Note (Signed)

## 2023-02-17 ENCOUNTER — Encounter: Payer: Self-pay | Admitting: Obstetrics and Gynecology

## 2023-02-17 ENCOUNTER — Inpatient Hospital Stay: Payer: Medicaid Other | Admitting: Anesthesiology

## 2023-02-17 ENCOUNTER — Other Ambulatory Visit: Payer: Self-pay

## 2023-02-17 ENCOUNTER — Inpatient Hospital Stay
Admission: EM | Admit: 2023-02-17 | Discharge: 2023-02-18 | DRG: 807 | Disposition: A | Discharge status: Home / Self Care | Payer: Medicaid Other | Attending: Obstetrics | Admitting: Obstetrics

## 2023-02-17 DIAGNOSIS — O9902 Anemia complicating childbirth: Secondary | ICD-10-CM | POA: Diagnosis present

## 2023-02-17 DIAGNOSIS — Z3A39 39 weeks gestation of pregnancy: Secondary | ICD-10-CM | POA: Diagnosis not present

## 2023-02-17 DIAGNOSIS — Z87891 Personal history of nicotine dependence: Secondary | ICD-10-CM | POA: Diagnosis not present

## 2023-02-17 DIAGNOSIS — Z3490 Encounter for supervision of normal pregnancy, unspecified, unspecified trimester: Secondary | ICD-10-CM

## 2023-02-17 DIAGNOSIS — Z8711 Personal history of peptic ulcer disease: Secondary | ICD-10-CM | POA: Diagnosis not present

## 2023-02-17 DIAGNOSIS — Z0000 Encounter for general adult medical examination without abnormal findings: Secondary | ICD-10-CM | POA: Diagnosis present

## 2023-02-17 LAB — TYPE AND SCREEN
ABO/RH(D): O POS
Antibody Screen: NEGATIVE

## 2023-02-17 LAB — CBC
HCT: 33.3 % — ABNORMAL LOW (ref 36.0–46.0)
Hemoglobin: 11.3 g/dL — ABNORMAL LOW (ref 12.0–15.0)
MCH: 28.4 pg (ref 26.0–34.0)
MCHC: 33.9 g/dL (ref 30.0–36.0)
MCV: 83.7 fL (ref 80.0–100.0)
Platelets: 357 10*3/uL (ref 150–400)
RBC: 3.98 MIL/uL (ref 3.87–5.11)
RDW: 13.7 % (ref 11.5–15.5)
WBC: 11.6 10*3/uL — ABNORMAL HIGH (ref 4.0–10.5)
nRBC: 0 % (ref 0.0–0.2)

## 2023-02-17 LAB — ABO/RH: ABO/RH(D): O POS

## 2023-02-17 LAB — RPR: RPR Ser Ql: NONREACTIVE

## 2023-02-17 MED ORDER — IBUPROFEN 600 MG PO TABS
600.0000 mg | ORAL_TABLET | Freq: Four times a day (QID) | ORAL | Status: DC
  Administered 2023-02-17 – 2023-02-18 (×4): 600 mg via ORAL
  Filled 2023-02-17 (×3): qty 1

## 2023-02-17 MED ORDER — OXYTOCIN-SODIUM CHLORIDE 30-0.9 UT/500ML-% IV SOLN: 1.0000 m[IU]/min | INTRAVENOUS | Status: DC

## 2023-02-17 MED ORDER — FERROUS SULFATE 325 (65 FE) MG PO TABS
325.0000 mg | ORAL_TABLET | Freq: Two times a day (BID) | ORAL | Status: DC
  Administered 2023-02-18: 325 mg via ORAL
  Filled 2023-02-17: qty 1

## 2023-02-17 MED ORDER — SENNOSIDES-DOCUSATE SODIUM 8.6-50 MG PO TABS
2.0000 | ORAL_TABLET | Freq: Every day | ORAL | Status: DC
  Administered 2023-02-18: 2 via ORAL
  Filled 2023-02-17: qty 2

## 2023-02-17 MED ORDER — FENTANYL-BUPIVACAINE-NACL 0.5-0.125-0.9 MG/250ML-% EP SOLN
EPIDURAL | Status: DC | PRN
  Administered 2023-02-17: 12 mL/h via EPIDURAL

## 2023-02-17 MED ORDER — ACETAMINOPHEN 325 MG PO TABS
650.0000 mg | ORAL_TABLET | ORAL | Status: DC | PRN
  Filled 2023-02-17: qty 2

## 2023-02-17 MED ORDER — OXYTOCIN-SODIUM CHLORIDE 30-0.9 UT/500ML-% IV SOLN: 2.5000 [IU]/h | INTRAVENOUS | Status: DC

## 2023-02-17 MED ORDER — WITCH HAZEL-GLYCERIN EX PADS
1.0000 | MEDICATED_PAD | CUTANEOUS | Status: DC | PRN
  Filled 2023-02-17: qty 100

## 2023-02-17 MED ORDER — EPHEDRINE 5 MG/ML INJ: 10.0000 mg | INTRAVENOUS | Status: DC | PRN

## 2023-02-17 MED ORDER — LACTATED RINGERS IV SOLN: INTRAVENOUS | Status: DC

## 2023-02-17 MED ORDER — OXYTOCIN-SODIUM CHLORIDE 30-0.9 UT/500ML-% IV SOLN
1.0000 m[IU]/min | INTRAVENOUS | Status: DC
  Filled 2023-02-17: qty 500

## 2023-02-17 MED ORDER — SOD CITRATE-CITRIC ACID 500-334 MG/5ML PO SOLN: 30.0000 mL | ORAL | Status: DC | PRN

## 2023-02-17 MED ORDER — SIMETHICONE 80 MG PO CHEW: 80.0000 mg | CHEWABLE_TABLET | ORAL | Status: DC | PRN

## 2023-02-17 MED ORDER — FENTANYL-BUPIVACAINE-NACL 0.5-0.125-0.9 MG/250ML-% EP SOLN
EPIDURAL | Status: AC
  Filled 2023-02-17: qty 250

## 2023-02-17 MED ORDER — DIPHENHYDRAMINE HCL 50 MG/ML IJ SOLN: 12.5000 mg | INTRAMUSCULAR | Status: DC | PRN

## 2023-02-17 MED ORDER — BENZOCAINE-MENTHOL 20-0.5 % EX AERO
1.0000 | INHALATION_SPRAY | CUTANEOUS | Status: DC | PRN
  Filled 2023-02-17: qty 56

## 2023-02-17 MED ORDER — DIBUCAINE (PERIANAL) 1 % EX OINT: 1.0000 | TOPICAL_OINTMENT | CUTANEOUS | Status: DC | PRN

## 2023-02-17 MED ORDER — OXYCODONE-ACETAMINOPHEN 5-325 MG PO TABS: 2.0000 | ORAL_TABLET | ORAL | Status: DC | PRN

## 2023-02-17 MED ORDER — TERBUTALINE SULFATE 1 MG/ML IJ SOLN: 0.2500 mg | Freq: Once | INTRAMUSCULAR | Status: DC | PRN

## 2023-02-17 MED ORDER — ONDANSETRON HCL 4 MG PO TABS: 4.0000 mg | ORAL_TABLET | ORAL | Status: DC | PRN

## 2023-02-17 MED ORDER — LIDOCAINE HCL (PF) 1 % IJ SOLN
INTRAMUSCULAR | Status: AC
  Filled 2023-02-17: qty 30

## 2023-02-17 MED ORDER — LIDOCAINE HCL (PF) 1 % IJ SOLN
INTRAMUSCULAR | Status: DC | PRN
  Administered 2023-02-17 (×2): 3 mL

## 2023-02-17 MED ORDER — AMMONIA AROMATIC IN INHA
RESPIRATORY_TRACT | Status: AC
  Filled 2023-02-17: qty 10

## 2023-02-17 MED ORDER — OXYTOCIN BOLUS FROM INFUSION
333.0000 mL | Freq: Once | INTRAVENOUS | Status: AC
  Administered 2023-02-17: 333 mL via INTRAVENOUS

## 2023-02-17 MED ORDER — ACETAMINOPHEN 325 MG PO TABS
650.0000 mg | ORAL_TABLET | ORAL | Status: DC | PRN
  Administered 2023-02-18 (×2): 650 mg via ORAL
  Filled 2023-02-17 (×2): qty 2

## 2023-02-17 MED ORDER — LACTATED RINGERS IV SOLN: 500.0000 mL | Freq: Once | INTRAVENOUS | Status: DC

## 2023-02-17 MED ORDER — OXYCODONE HCL 5 MG PO TABS
5.0000 mg | ORAL_TABLET | ORAL | Status: DC | PRN
  Filled 2023-02-17: qty 1

## 2023-02-17 MED ORDER — BUPIVACAINE HCL (PF) 0.25 % IJ SOLN
INTRAMUSCULAR | Status: DC | PRN
  Administered 2023-02-17 (×2): 5 mL via EPIDURAL

## 2023-02-17 MED ORDER — OXYCODONE-ACETAMINOPHEN 5-325 MG PO TABS: 1.0000 | ORAL_TABLET | ORAL | Status: DC | PRN

## 2023-02-17 MED ORDER — COCONUT OIL OIL: 1.0000 | TOPICAL_OIL | Status: DC | PRN

## 2023-02-17 MED ORDER — MISOPROSTOL 25 MCG QUARTER TABLET
25.0000 ug | ORAL_TABLET | ORAL | Status: DC
  Administered 2023-02-17: 25 ug via VAGINAL
  Filled 2023-02-17: qty 1

## 2023-02-17 MED ORDER — PHENYLEPHRINE 80 MCG/ML (10ML) SYRINGE FOR IV PUSH (FOR BLOOD PRESSURE SUPPORT): 80.0000 ug | PREFILLED_SYRINGE | INTRAVENOUS | Status: DC | PRN

## 2023-02-17 MED ORDER — IBUPROFEN 600 MG PO TABS
ORAL_TABLET | ORAL | Status: AC
  Filled 2023-02-17: qty 1

## 2023-02-17 MED ORDER — LIDOCAINE-EPINEPHRINE (PF) 1.5 %-1:200000 IJ SOLN
INTRAMUSCULAR | Status: DC | PRN
  Administered 2023-02-17: 3 mL via PERINEURAL

## 2023-02-17 MED ORDER — ONDANSETRON HCL 4 MG/2ML IJ SOLN: 4.0000 mg | Freq: Four times a day (QID) | INTRAMUSCULAR | Status: DC | PRN

## 2023-02-17 MED ORDER — MISOPROSTOL 25 MCG QUARTER TABLET
25.0000 ug | ORAL_TABLET | ORAL | Status: DC
  Filled 2023-02-17: qty 1

## 2023-02-17 MED ORDER — ONDANSETRON HCL 4 MG/2ML IJ SOLN: 4.0000 mg | INTRAMUSCULAR | Status: DC | PRN

## 2023-02-17 MED ORDER — OXYCODONE HCL 5 MG PO TABS: 10.0000 mg | ORAL_TABLET | ORAL | Status: DC | PRN

## 2023-02-17 MED ORDER — OXYTOCIN 10 UNIT/ML IJ SOLN
INTRAMUSCULAR | Status: AC
  Filled 2023-02-17: qty 2

## 2023-02-17 MED ORDER — FENTANYL CITRATE (PF) 100 MCG/2ML IJ SOLN: 50.0000 ug | INTRAMUSCULAR | Status: DC | PRN

## 2023-02-17 MED ORDER — MISOPROSTOL 200 MCG PO TABS
ORAL_TABLET | ORAL | Status: AC
  Administered 2023-02-17: 25 ug via ORAL
  Filled 2023-02-17: qty 4

## 2023-02-17 MED ORDER — DIPHENHYDRAMINE HCL 25 MG PO CAPS: 25.0000 mg | ORAL_CAPSULE | Freq: Four times a day (QID) | ORAL | Status: DC | PRN

## 2023-02-17 MED ORDER — FENTANYL-BUPIVACAINE-NACL 0.5-0.125-0.9 MG/250ML-% EP SOLN: 12.0000 mL/h | EPIDURAL | Status: DC | PRN

## 2023-02-17 MED ORDER — TETANUS-DIPHTH-ACELL PERTUSSIS 5-2.5-18.5 LF-MCG/0.5 IM SUSY: 0.5000 mL | PREFILLED_SYRINGE | Freq: Once | INTRAMUSCULAR | Status: DC

## 2023-02-17 MED ORDER — LACTATED RINGERS IV SOLN: 500.0000 mL | INTRAVENOUS | Status: DC | PRN

## 2023-02-17 MED ORDER — LIDOCAINE HCL (PF) 1 % IJ SOLN: 30.0000 mL | INTRAMUSCULAR | Status: DC | PRN

## 2023-02-17 MED ORDER — PRENATAL MULTIVITAMIN CH
1.0000 | ORAL_TABLET | Freq: Every day | ORAL | Status: DC
  Administered 2023-02-18: 1 via ORAL
  Filled 2023-02-17: qty 1

## 2023-02-17 NOTE — Discharge Summary (Signed)
Obstetrical Discharge Summary  Patient Name: Kendra Cole DOB: [birth year] MRN: [id number]  Date of Admission: 02/17/2023 Date of Delivery: 02/17/23 Delivered by: Haroldine Laws, CNM  Date of Discharge: 02/18/2023  Primary OB: [facility] [id number] last menstrual period was 05/25/2022. EDC Estimated Date of Delivery: 02/20/23 Gestational Age at Delivery: [redacted]w[redacted]d   Antepartum complications:  History of preterm delivery with G1 - induced @ 36wks for oligo and IUGR History of IUGR Anemia in third trimester  Admitting Diagnosis: Encounter for elective induction of labor [Z34.90]    Secondary Diagnosis: Patient Active Problem List   Diagnosis Date Noted   Encounter for elective induction of labor 02/17/2023   NSVD (normal spontaneous vaginal delivery) 02/17/2023   Edema in pregnancy in third trimester 02/03/2023   Supervision of normal pregnancy 07/25/2022    Discharge Diagnosis: Term Pregnancy Delivered       Induction / Augmentation: AROM, Pitocin, and Cytotec Complications: None Intrapartum complications/course: see delivery note Delivery Type: spontaneous vaginal delivery Anesthesia: epidural anesthesia Placenta: spontaneous To Pathology: No  Laceration: perineal "skidmarks" Episiotomy: none Newborn Data: Live born female  Birth Weight:  6lb 10.2oz APGAR: 8, 9  Newborn Delivery   Birth date/time: 02/17/2023 14:18:00 Delivery type: Vaginal, Spontaneous     Postpartum Procedures: none Edinburgh:     02/18/2023    1:28 AM 02/17/2023    8:25 PM  Edinburgh Postnatal Depression Scale Screening Tool  I have been able to laugh and see the funny side of things. 0 --  I have looked forward with enjoyment to things. 0   I have blamed myself unnecessarily when things went wrong. 1   I have been anxious or worried for no good reason. 2   I have felt scared or panicky for no good reason. 1   Things have been getting on top of me. 1   I have been so unhappy that I  have had difficulty sleeping. 0   I have felt sad or miserable. 1   I have been so unhappy that I have been crying. 0   The thought of harming myself has occurred to me. 0   Edinburgh Postnatal Depression Scale Total 6      Post partum course:   Patient had an uncomplicated postpartum course.  By time of discharge on PPD#1, her pain was controlled on oral pain medications; she had appropriate lochia and was ambulating, voiding without difficulty and tolerating regular diet. BP was elevated briefly but returned to normal on PPD1. She was deemed stable for discharge to home.    Discharge Physical Exam:   BP 117/85   Pulse 85   Temp 97.8 F (36.6 C) (Oral)   Resp 17   Ht 5\' 1"  (1.549 m)   Wt 71.2 kg   LMP 05/25/2022   SpO2 98%   Breastfeeding Unknown   BMI 29.66 kg/m   General: NAD CV: RRR Pulm: CTABL, nl effort ABD: s/nd/nt, fundus firm and below the umbilicus Lochia: moderate Perineum: minimal edema/intact DVT Evaluation: LE non-ttp, no evidence of DVT on exam.  Hemoglobin  Date Value Ref Range Status  02/18/2023 10.4 (L) 12.0 - 15.0 g/dL Final   HGB  Date Value Ref Range Status  05/18/2014 12.8 12.0 - 16.0 g/dL Final   HCT  Date Value Ref Range Status  02/18/2023 30.7 (L) 36.0 - 46.0 % Final  05/18/2014 39.3 35.0 - 47.0 % Final    Risk assessment for postpartum VTE and prophylactic treatment: Very high risk  factors: None High risk factors: None Moderate risk factors: None  Postpartum VTE prophylaxis with LMWH not indicated  Disposition: stable, discharge to home. Baby Feeding: breast feeding Baby Disposition: home with mom  Rh Immune globulin indicated: No Rubella vaccine given: was not indicated Varivax vaccine given: was not indicated Flu vaccine given in AP setting: n/a Tdap vaccine given in AP setting: Yes   Contraception:  TBD  Prenatal Labs:  Blood type/Rh O POS Performed at [facility], [address] [place], [place] [address]     Antibody screen Negative    Rubella Immune (12/27 0000)   Varicella Immune  RPR Nonreactive (06/27 0000)   HBsAg Negative (12/27 0000)  Hep C NR   HIV Non-reactive (06/27 0000)   GC neg  Chlamydia neg  Genetic screening cfDNA negative   1 hour GTT 120  3 hour GTT N/a  GBS Neg      Plan:  Kendra Cole was discharged to home in good condition.  Discharge Medications: Allergies as of 02/18/2023   No Known Allergies      Medication List     STOP taking these medications    metroNIDAZOLE 500 MG tablet Commonly known as: FLAGYL       TAKE these medications    acetaminophen 325 MG tablet Commonly known as: Tylenol Take 2 tablets (650 mg total) by mouth every 6 (six) hours as needed for fever or mild pain (for pain scale < 4).   albuterol 108 (90 Base) MCG/ACT inhaler Commonly known as: VENTOLIN HFA Inhale 2 puffs into the lungs every 4 (four) hours as needed for wheezing or shortness of breath.   benzocaine-Menthol 20-0.5 % Aero Commonly known as: DERMOPLAST Apply 1 Application topically as needed for irritation (perineal discomfort).   ferrous sulfate 325 (65 FE) MG tablet Take 1 tablet (325 mg total) by mouth 2 (two) times daily with a meal.   ibuprofen 600 MG tablet Commonly known as: ADVIL Take 1 tablet (600 mg total) by mouth every 6 (six) hours as needed for cramping, fever or mild pain.   multivitamin-prenatal 27-0.8 MG Tabs tablet Take 1 tablet by mouth daily at 12 noon.   senna-docusate 8.6-50 MG tablet Commonly known as: Senokot-S Take 2 tablets by mouth at bedtime as needed for mild constipation.   witch hazel-glycerin pad Commonly known as: TUCKS Apply 1 Application topically as needed for hemorrhoids.         Follow-up Information     Haroldine Laws, CNM. Schedule an appointment as soon as possible for a visit in 6 week(s).   Specialty: Certified Nurse Midwife Contact information: [address] [place]  [address] [phone]         [facility] Follow up in 3 day(s).   Why: BP check Contact information: [address] [place] [address] [phone]                Signed:  Blanchard Kelch 02/18/2023 12:31 PM

## 2023-02-17 NOTE — Progress Notes (Signed)
Labor Progress Note  Kendra Cole is a 32 y.o. G3P1 at [redacted]w[redacted]d by LMP admitted for induction of labor due to Elective at term.  Subjective: Pt is feeling like she needs to push  Objective: BP 116/76   Pulse 85   Temp 98.2 F (36.8 C)   Resp 16   Ht 5\' 1"  (1.549 m)   Wt 71.2 kg   LMP 05/25/2022   SpO2 98%   BMI 29.66 kg/m    Fetal Assessment: FHT:  FHR: 130 bpm, variability: moderate,  accelerations:  Present,  decelerations:  Absent Category/reactivity:  Category I UC:   regular, every 1-6 minutes SVE:    Dilation: 10 cm  Effacement: 100%  Station:  +1  Consistency: soft  Position: anterior  Membrane status: AROM at 1029 Amniotic color: n/a  Labs: Lab Results  Component Value Date   WBC 11.6 (H) 02/17/2023   HGB 11.3 (L) 02/17/2023   HCT 33.3 (L) 02/17/2023   MCV 83.7 02/17/2023   PLT 357 02/17/2023    Assessment / Plan: Induction of labor due to elective,  progressing well  0145 2/50 0212 PV and PO 0616 4/70/-3 1029 3/50/-3 AROM  1230 8/80/-1 1330 9.5/90/0  Labor: Progressing normally Preeclampsia:   116/76 Fetal Wellbeing:  Category I Pain Control:  Epidural  I/D:   Afebrile, GBS neg, AROM x3.5hr Anticipated MOD:  NSVD  Jenifer E Hanni Milford, CNM 02/17/2023, 2:00 PM

## 2023-02-17 NOTE — Anesthesia Procedure Notes (Signed)
Epidural Patient location during procedure: OB Start time: 02/17/2023 9:25 AM End time: 02/17/2023 9:39 AM  Staffing Anesthesiologist: Louie Boston, MD Performed: anesthesiologist   Preanesthetic Checklist Completed: patient identified, IV checked, site marked, risks and benefits discussed, surgical consent, monitors and equipment checked, pre-op evaluation and timeout performed  Epidural Patient position: sitting Prep: ChloraPrep Patient monitoring: heart rate, continuous pulse ox and blood pressure Approach: midline Location: L3-L4 Injection technique: LOR saline  Needle:  Needle type: Tuohy  Needle gauge: 17 G Needle length: 9 cm and 9 Needle insertion depth: 6.5 cm Catheter type: closed end flexible Catheter size: 19 Gauge Catheter at skin depth: 12 cm Test dose: negative and 1.5% lidocaine with Epi 1:200 K  Assessment Sensory level: T10 Events: blood not aspirated, injection not painful, no injection resistance, no paresthesia and negative IV test  Additional Notes 2 attempts. Patient described pressure and sharp shooting pain on L side with insertion of catheter. Catheter removed, pain resolved. Went one level above, patient complain of pressure but no pain, proceeded with catheter insertion with no pain. NO pain with test dose or initial bolus.  Pt. Evaluated and documentation done after procedure finished. Patient identified. Risks/Benefits/Options discussed with patient including but not limited to bleeding, infection, nerve damage, paralysis, failed block, incomplete pain control, headache, blood pressure changes, nausea, vomiting, reactions to medication both or allergic, itching and postpartum back pain. Confirmed with bedside nurse the patient's most recent platelet count. Confirmed with patient that they are not currently taking any anticoagulation, have any bleeding history or any family history of bleeding disorders. Patient expressed understanding and wished to  proceed. All questions were answered. Sterile technique was used throughout the entire procedure. Please see nursing notes for vital signs. Test dose was given through epidural catheter and negative prior to continuing to dose epidural or start infusion. Warning signs of high block given to the patient including shortness of breath, tingling/numbness in hands, complete motor block, or any concerning symptoms with instructions to call for help. Patient was given instructions on fall risk and not to get out of bed. All questions and concerns addressed with instructions to call with any issues or inadequate analgesia.    Patient tolerated the insertion well without immediate complications. Reason for block:procedure for pain

## 2023-02-17 NOTE — Progress Notes (Signed)
Labor Progress Note  Kendra Cole is a 32 y.o. G3P1 at [redacted]w[redacted]d by LMP admitted for induction of labor due to Elective at term.  Subjective: Pt comfortable with epidural and getting some rest  Objective: BP (!) 134/91 (BP Location: Right Arm)   Pulse 78   Temp 98.2 F (36.8 C) (Oral)   Resp 16   Ht 5\' 1"  (1.549 m)   Wt 71.2 kg   LMP 05/25/2022   BMI 29.66 kg/m    Fetal Assessment: FHT:  FHR: 120 bpm, variability: moderate,  accelerations:  Present,  decelerations:  Absent Category/reactivity:  Category I UC:   regular, every 1-6 minutes SVE:    Dilation: 3 cm  Effacement: 50%  Station:  -3  Consistency: medium  Position: anterior  Membrane status: AROM at 1029 Amniotic color: n/a  Labs: Lab Results  Component Value Date   WBC 11.6 (H) 02/17/2023   HGB 11.3 (L) 02/17/2023   HCT 33.3 (L) 02/17/2023   MCV 83.7 02/17/2023   PLT 357 02/17/2023    Assessment / Plan: Induction of labor due to elective,  progressing well  0145 2/50 0212 PV and PO 0616 4/70/-3 1029 3/50/-3 AROM   Labor: Progressing normally Preeclampsia:   127/82 Fetal Wellbeing:  Category I Pain Control:  Epidural  I/D:   Afebrile, GBS neg, AROM  Anticipated MOD:  NSVD  Jenifer E Lyzbeth Genrich, CNM 02/17/2023, 10:30 AM

## 2023-02-17 NOTE — Progress Notes (Signed)
Labor Progress Note  Kendra Cole is a 32 y.o. G3P1 at [redacted]w[redacted]d by LMP admitted for induction of labor due to Elective at term.  Subjective: Assumed care, pt would like an epidural  Objective: BP (!) 134/91 (BP Location: Right Arm)   Pulse 78   Temp 98.2 F (36.8 C) (Oral)   Resp 16   Ht 5\' 1"  (1.549 m)   Wt 71.2 kg   LMP 05/25/2022   BMI 29.66 kg/m    Fetal Assessment: FHT:  FHR: 120 bpm, variability: moderate,  accelerations:  Present,  decelerations:  Absent Category/reactivity:  Category I UC:   regular, every 1-6 minutes SVE:    Dilation: 4 cm  Effacement: 70%  Station:  -3  Consistency: ---  Position: ---  Membrane status: Intact Amniotic color: n/a  Labs: Lab Results  Component Value Date   WBC 11.6 (H) 02/17/2023   HGB 11.3 (L) 02/17/2023   HCT 33.3 (L) 02/17/2023   MCV 83.7 02/17/2023   PLT 357 02/17/2023    Assessment / Plan: Induction of labor due to elective,  progressing well  0145 2/50 0212 PV and PO 0616 4/70/-3  Labor: Progressing normally Preeclampsia:   134/91 Fetal Wellbeing:  Category I Pain Control:  Labor support without medications - asking for epidural I/D:   Afebrile, GBS neg, Intact Anticipated MOD:  NSVD  [name], CNM 02/17/2023, 9:33 AM

## 2023-02-17 NOTE — Anesthesia Preprocedure Evaluation (Signed)
Anesthesia Evaluation  Patient identified by MRN, date of birth, ID band Patient awake    Reviewed: Allergy & Precautions, NPO status , Patient's Chart, lab work & pertinent test results  History of Anesthesia Complications Negative for: history of anesthetic complications  Airway Mallampati: III  TM Distance: >3 FB Neck ROM: full    Dental no notable dental hx.    Pulmonary asthma , former smoker   Pulmonary exam normal        Cardiovascular Exercise Tolerance: Good negative cardio ROS Normal cardiovascular exam     Neuro/Psych  PSYCHIATRIC DISORDERS Anxiety        GI/Hepatic negative GI ROS,,,  Endo/Other    Renal/GU   negative genitourinary   Musculoskeletal   Abdominal   Peds  Hematology negative hematology ROS (+)   Anesthesia Other Findings Past Medical History: No date: Anxiety No date: Asthma No date: History of stomach ulcers  Past Surgical History: No date: NO PAST SURGERIES  BMI    Body Mass Index: 29.66 kg/m      Reproductive/Obstetrics (+) Pregnancy                             Anesthesia Physical Anesthesia Plan  ASA: 2  Anesthesia Plan: Epidural   Post-op Pain Management:    Induction:   PONV Risk Score and Plan:   Airway Management Planned: Natural Airway  Additional Equipment:   Intra-op Plan:   Post-operative Plan:   Informed Consent: I have reviewed the patients History and Physical, chart, labs and discussed the procedure including the risks, benefits and alternatives for the proposed anesthesia with the patient or authorized representative who has indicated his/her understanding and acceptance.     Dental Advisory Given  Plan Discussed with: Anesthesiologist  Anesthesia Plan Comments: (Patient reports no bleeding problems and no anticoagulant use.   Patient consented for risks of anesthesia including but not limited to:  - adverse  reactions to medications - risk of bleeding, infection and or nerve damage from epidural that could lead to paralysis - risk of headache or failed epidural - nerve damage due to positioning - that if epidural is used for C-section that there is a chance of epidural failure requiring spinal placement or conversion to GA - Damage to heart, brain, lungs, other parts of body or loss of life  Patient voiced understanding.)       Anesthesia Quick Evaluation
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
## 2023-02-18 LAB — CBC
HCT: 30.7 % — ABNORMAL LOW (ref 36.0–46.0)
Hemoglobin: 10.4 g/dL — ABNORMAL LOW (ref 12.0–15.0)
MCH: 28.3 pg (ref 26.0–34.0)
MCHC: 33.9 g/dL (ref 30.0–36.0)
MCV: 83.7 fL (ref 80.0–100.0)
Platelets: 296 10*3/uL (ref 150–400)
RBC: 3.67 MIL/uL — ABNORMAL LOW (ref 3.87–5.11)
RDW: 14 % (ref 11.5–15.5)
WBC: 10.7 10*3/uL — ABNORMAL HIGH (ref 4.0–10.5)
nRBC: 0 % (ref 0.0–0.2)

## 2023-02-18 MED ORDER — BENZOCAINE-MENTHOL 20-0.5 % EX AERO: 1.0000 | INHALATION_SPRAY | CUTANEOUS | 0 refills | Status: AC | PRN

## 2023-02-18 MED ORDER — IBUPROFEN 600 MG PO TABS: 600.0000 mg | ORAL_TABLET | Freq: Four times a day (QID) | ORAL | 0 refills | Status: AC | PRN

## 2023-02-18 MED ORDER — FERROUS SULFATE 325 (65 FE) MG PO TABS: 325.0000 mg | ORAL_TABLET | Freq: Two times a day (BID) | ORAL | 3 refills | Status: AC

## 2023-02-18 MED ORDER — ACETAMINOPHEN 325 MG PO TABS: 650.0000 mg | ORAL_TABLET | Freq: Four times a day (QID) | ORAL | 0 refills | Status: AC | PRN

## 2023-02-18 MED ORDER — WITCH HAZEL-GLYCERIN EX PADS: 1.0000 | MEDICATED_PAD | CUTANEOUS | 0 refills | Status: AC | PRN

## 2023-02-18 MED ORDER — SENNOSIDES-DOCUSATE SODIUM 8.6-50 MG PO TABS: 2.0000 | ORAL_TABLET | Freq: Every evening | ORAL | 0 refills | Status: AC | PRN

## 2023-02-18 NOTE — Progress Notes (Signed)
Patient discharged home with family.  Discharge instructions, when to follow up, and prescriptions reviewed with patient.  Patient verbalized understanding. Patient will be escorted out by auxiliary.   

## 2023-02-18 NOTE — Discharge Instructions (Signed)

## 2023-02-18 NOTE — Anesthesia Postprocedure Evaluation (Signed)
Anesthesia Post Note  Patient: Kendra Cole  Procedure(s) Performed: AN AD HOC LABOR EPIDURAL  Patient location during evaluation: Mother Baby Anesthesia Type: Epidural Level of consciousness: awake and alert Pain management: pain level controlled Vital Signs Assessment: post-procedure vital signs reviewed and stable Respiratory status: spontaneous breathing, nonlabored ventilation and respiratory function stable Cardiovascular status: stable Postop Assessment: no headache, no backache and epidural receding Anesthetic complications: no   No notable events documented.   Last Vitals:  Vitals:   02/18/23 0715 02/18/23 1053  BP: (!) 130/95 117/85  Pulse: 76 85  Resp: 17   Temp:  36.6 C  SpO2: 100% 98%    Last Pain:  Vitals:   02/18/23 1053  TempSrc: Oral  PainSc:                  [name]

## 2023-02-20 ENCOUNTER — Telehealth: Payer: Self-pay

## 2023-02-20 ENCOUNTER — Inpatient Hospital Stay: Admit: 2023-02-20 | Payer: Self-pay

## 2023-02-20 NOTE — Telephone Encounter (Signed)
[facility]- Discharge Call Backs-Spoke to patient on the phone about the following below. 1-Do you have any questions or concerns about yourself as you heal?Back pain from epidural.  Going back to OBGYN today about this and BP check. 2-Any concerns or questions about your baby?No Is your baby eating, peeing,pooping well?Yes 3-Reviewed ABC's of safe sleep. 4-How was your stay at the hospital?Good 5- Did our team work together to care for you?Yes You should be receiving a survey in the mail soon.   We would really appreciate it if you could fill that out for [place] and return it in the mail.  We value the feedback to make improvements and continue the great work we do.   If you have any questions please feel free to call me back at [phone]

## 2023-09-06 ENCOUNTER — Ambulatory Visit
Admission: EM | Admit: 2023-09-06 | Discharge: 2023-09-06 | Disposition: A | Discharge status: Home / Self Care | Payer: Medicaid Other | Attending: Emergency Medicine | Admitting: Emergency Medicine

## 2023-09-06 DIAGNOSIS — J069 Acute upper respiratory infection, unspecified: Secondary | ICD-10-CM | POA: Diagnosis present

## 2023-09-06 LAB — RESP PANEL BY RT-PCR (RSV, FLU A&B, COVID)  RVPGX2
Influenza A by PCR: NEGATIVE
Influenza B by PCR: NEGATIVE
Resp Syncytial Virus by PCR: NEGATIVE
SARS Coronavirus 2 by RT PCR: NEGATIVE

## 2023-09-06 NOTE — ED Provider Notes (Signed)
 [facility]    CSN: [id number] Arrival date & time: 09/06/23  1516      History   Chief Complaint Chief Complaint  Patient presents with  . Cough  . Nasal Congestion    HPI Kendra Cole is a 33 y.o. female.   33 year old female, Kendra Cole, presents to urgent care for evaluation of cough and nasal congestion x 3 days.  Patient is here with daughter who has similar symptoms.  Patient is breast-feeding, unknown illness exposure.  The history is provided by the patient. No language interpreter was used.    Past Medical History:  Diagnosis Date  . Anxiety   . Asthma   . History of stomach ulcers     Patient Active Problem List   Diagnosis Date Noted  . Viral URI with cough 09/06/2023  . Encounter for elective induction of labor 02/17/2023  . NSVD (normal spontaneous vaginal delivery) 02/17/2023  . Edema in pregnancy in third trimester 02/03/2023  . Supervision of normal pregnancy 07/25/2022    Past Surgical History:  Procedure Laterality Date  . NO PAST SURGERIES      OB History     Gravida  3   Para  2   Term  1   Preterm      AB      Living  1      SAB      IAB      Ectopic      Multiple  0   Live Births  2            Home Medications    Prior to Admission medications   Medication Sig Start Date End Date Taking? Authorizing Provider  albuterol  (VENTOLIN  HFA) 108 (90 Base) MCG/ACT inhaler Inhale 2 puffs into the lungs every 4 (four) hours as needed for wheezing or shortness of breath. 08/18/22  Yes White, Shelba SAUNDERS, NP  acetaminophen  (TYLENOL ) 325 MG tablet Take 2 tablets (650 mg total) by mouth every 6 (six) hours as needed for fever or mild pain (for pain scale < 4). 02/18/23   Tanda Edsel Fuller, CNM  benzocaine -Menthol  (DERMOPLAST) 20-0.5 % AERO Apply 1 Application topically as needed for irritation (perineal discomfort). 02/18/23   Tanda Edsel Fuller, CNM  ferrous sulfate  325 (65 FE) MG tablet Take 1 tablet (325  mg total) by mouth 2 (two) times daily with a meal. 02/18/23   Wilson, Edsel Fuller, CNM  ibuprofen  (ADVIL ) 600 MG tablet Take 1 tablet (600 mg total) by mouth every 6 (six) hours as needed for cramping, fever or mild pain. 02/18/23   Tanda Edsel Fuller, CNM  Prenatal Vit-Fe Fumarate-FA (MULTIVITAMIN-PRENATAL) 27-0.8 MG TABS tablet Take 1 tablet by mouth daily at 12 noon.    [provider]  senna-docusate (SENOKOT-S) 8.6-50 MG tablet Take 2 tablets by mouth at bedtime as needed for mild constipation. 02/18/23   Tanda Edsel Fuller, CNM  witch hazel-glycerin  (TUCKS) pad Apply 1 Application topically as needed for hemorrhoids. 02/18/23   Tanda Edsel Fuller, CNM    Family History History reviewed. No pertinent family history.  Social History Social History   Tobacco Use  . Smoking status: Former    Current packs/day: 0.50    Types: Cigarettes  . Smokeless tobacco: Never  Vaping Use  . Vaping status: Never Used  Substance Use Topics  . Alcohol use: Not Currently  . Drug use: No     Allergies   Patient has no known allergies.  Review of Systems Review of Systems  Constitutional:  Positive for fever.  HENT:  Positive for congestion.   Respiratory:  Positive for cough.   All other systems reviewed and are negative.    Physical Exam Triage Vital Signs ED Triage Vitals  Encounter Vitals Group     BP 09/06/23 1537 (!) 113/92     Systolic BP Percentile --      Diastolic BP Percentile --      Pulse Rate 09/06/23 1537 99     Resp 09/06/23 1537 16     Temp 09/06/23 1537 98.7 F (37.1 C)     Temp Source 09/06/23 1537 Oral     SpO2 09/06/23 1537 100 %     Weight 09/06/23 1535 139 lb 6.4 oz (63.2 kg)     Height 09/06/23 1535 5' 1 (1.549 m)     Head Circumference --      Peak Flow --      Pain Score 09/06/23 1535 1     Pain Loc --      Pain Education --      Exclude from Growth Chart --    No data found.  Updated Vital Signs BP (!) 113/92 (BP  Location: Left Arm)   Pulse 99   Temp 98.7 F (37.1 C) (Oral)   Resp 16   Ht 5' 1 (1.549 m)   Wt 139 lb 6.4 oz (63.2 kg)   LMP  (LMP Unknown)   SpO2 100%   Breastfeeding Yes   BMI 26.34 kg/m   Visual Acuity Right Eye Distance:   Left Eye Distance:   Bilateral Distance:    Right Eye Near:   Left Eye Near:    Bilateral Near:     Physical Exam Vitals and nursing note reviewed.  Constitutional:      General: She is not in acute distress.    Appearance: She is well-developed.  HENT:     Head: Normocephalic.     Right Ear: Tympanic membrane is retracted.     Left Ear: Tympanic membrane is retracted.     Nose: Congestion present.     Mouth/Throat:     Lips: Pink.     Mouth: Mucous membranes are moist.     Pharynx: Oropharynx is clear.  Eyes:     General: Lids are normal.     Conjunctiva/sclera: Conjunctivae normal.     Pupils: Pupils are equal, round, and reactive to light.  Neck:     Trachea: No tracheal deviation.  Cardiovascular:     Rate and Rhythm: Normal rate and regular rhythm.     Pulses: Normal pulses.     Heart sounds: Normal heart sounds. No murmur heard. Pulmonary:     Effort: Pulmonary effort is normal.     Breath sounds: Normal breath sounds and air entry.  Abdominal:     General: Bowel sounds are normal.     Palpations: Abdomen is soft.     Tenderness: There is no abdominal tenderness.  Musculoskeletal:        General: Normal range of motion.     Cervical back: Normal range of motion.  Lymphadenopathy:     Cervical: No cervical adenopathy.  Skin:    General: Skin is warm and dry.     Findings: No rash.  Neurological:     General: No focal deficit present.     Mental Status: She is alert and oriented to person, place, and time.     GCS:  GCS eye subscore is 4. GCS verbal subscore is 5. GCS motor subscore is 6.  Psychiatric:        Attention and Perception: Attention normal.        Mood and Affect: Mood normal.        Speech: Speech normal.         Behavior: Behavior normal. Behavior is cooperative.      UC Treatments / Results  Labs (all labs ordered are listed, but only abnormal results are displayed) Labs Reviewed  RESP PANEL BY RT-PCR (RSV, FLU A&B, COVID)  [id number]    EKG   Radiology No results found.  Procedures Procedures (including critical care time)  Medications Ordered in UC Medications - No data to display  Initial Impression / Assessment and Plan / UC Course  I have reviewed the triage vital signs and the nursing notes.  Pertinent labs & imaging results that were available during my care of the patient were reviewed by me and considered in my medical decision making (see chart for details).  Clinical Course as of 09/06/23 1715  Thu Sep 06, 2023  1543 Flu, COVID, RSV are negative. [JD]    Clinical Course User Index [JD] Cane Dubray, Rilla, NP   Discussed exam findings and plan of care with patient, strict go to ER precautions given.   Patient verbalized understanding to this provider.  Ddx: Viral URI with cough, allergies Final Clinical Impressions(s) / UC Diagnoses   Final diagnoses:  Viral URI with cough     Discharge Instructions      Most likely you have a viral illness: no antibiotic is indicated at this time, May treat with OTC meds of choice. Make sure to drink plenty of fluids to stay hydrated(gatorade, water, popsicles,jello,etc), avoid caffeine products. Follow up with PCP. Return as needed.     ED Prescriptions   None    PDMP not reviewed this encounter.   Aminta Rilla, NP 09/06/23 1715

## 2023-09-06 NOTE — ED Triage Notes (Signed)
 Pt c/o Sneezing, productive cough, nasal congestion, temp of 102, body chills x3days

## 2023-09-06 NOTE — Discharge Instructions (Addendum)
Most likely you have a viral illness: no antibiotic is indicated at this time, May treat with OTC meds of choice. Make sure to drink plenty of fluids to stay hydrated(gatorade, water, popsicles,jello,etc), avoid caffeine products. Follow up with PCP. Return as needed.

## 2024-02-26 ENCOUNTER — Other Ambulatory Visit: Payer: Self-pay | Admitting: Medical Genetics

## 2024-02-27 ENCOUNTER — Other Ambulatory Visit
Admission: RE | Admit: 2024-02-27 | Discharge: 2024-02-27 | Disposition: A | Discharge status: Home / Self Care | Payer: Self-pay | Source: Ambulatory Visit | Attending: Medical Genetics | Admitting: Medical Genetics

## 2024-03-09 LAB — GENECONNECT MOLECULAR SCREEN: Genetic Analysis Overall Interpretation: NEGATIVE

## 2024-03-12 ENCOUNTER — Ambulatory Visit (INDEPENDENT_AMBULATORY_CARE_PROVIDER_SITE_OTHER)

## 2024-03-12 ENCOUNTER — Ambulatory Visit: Admission: EM | Admit: 2024-03-12 | Discharge: 2024-03-12 | Disposition: A | Discharge status: Home / Self Care

## 2024-03-12 DIAGNOSIS — M25531 Pain in right wrist: Secondary | ICD-10-CM | POA: Diagnosis not present

## 2024-03-12 NOTE — ED Triage Notes (Signed)
 Pt c/o R wrist,elbow & hand pain x1 wk. Denies any falls or injuries. Pain worsening daily. Hx of arm fx. Has tylenol  w/o relief.

## 2024-03-12 NOTE — ED Provider Notes (Addendum)
 [facility]    CSN: [id number] Arrival date & time: 03/12/24  1851      History   Chief Complaint Chief Complaint  Patient presents with   Wrist Pain   Hand Pain    HPI Kendra Cole is a 33 y.o. female.   HPI  33 year old female with past medical history significant for peptic ulcers, asthma, and anxiety presents for evaluation of pain in her right wrist that radiates down into the back of her hand and up to her elbow that has been going on for the past week.  She has a history of a previous fracture in that arm but she denies any new injuries.  She honestly cannot remember when during last week she first noticed the pain.  No numbness or tingling in her fingers.  Past Medical History:  Diagnosis Date   Anxiety    Asthma    History of stomach ulcers     Patient Active Problem List   Diagnosis Date Noted   Viral URI with cough 09/06/2023   NSVD (normal spontaneous vaginal delivery) 02/17/2023   Edema in pregnancy in third trimester 02/03/2023   Supervision of normal pregnancy 07/25/2022   Left ankle pain 06/26/2022   Pregnancy test positive 06/26/2022   Attention deficit disorder 08/31/2020   Dysmenorrhea 08/31/2020   Short attention span 08/31/2020   Impetigo 01/14/2020   Insomnia, unspecified 07/05/2018   Pain in pelvis 03/19/2018   Routine history and physical examination of adult 09/17/2017   Anxiety states 04/24/2017   Family history of breast cancer 01/22/2017   Low grade squamous intraepith lesion on cytologic smear cervix (lgsil) 09/19/2016   Major depression, recurrent (HCC) 05/03/2015   Tobacco dependence syndrome 11/25/2013    Past Surgical History:  Procedure Laterality Date   NO PAST SURGERIES      OB History     Gravida  3   Para  2   Term  1   Preterm      AB      Living  1      SAB      IAB      Ectopic      Multiple  0   Live Births  2            Home Medications    Prior to Admission medications    Medication Sig Start Date End Date Taking? Authorizing Provider  clotrimazole-betamethasone (LOTRISONE) cream Apply topically 2 (two) times daily 10/24/23 10/23/24 Yes [provider]  acetaminophen  (TYLENOL ) 325 MG tablet Take 2 tablets (650 mg total) by mouth every 6 (six) hours as needed for fever or mild pain (for pain scale < 4). 02/18/23   Tanda Edsel Fuller, CNM  albuterol  (VENTOLIN  HFA) 108 (90 Base) MCG/ACT inhaler Inhale 2 puffs into the lungs every 4 (four) hours as needed for wheezing or shortness of breath. 08/18/22   White, Shelba SAUNDERS, NP  benzocaine -Menthol  (DERMOPLAST) 20-0.5 % AERO Apply 1 Application topically as needed for irritation (perineal discomfort). 02/18/23   Tanda Edsel Fuller, CNM  ferrous sulfate  325 (65 FE) MG tablet Take 1 tablet (325 mg total) by mouth 2 (two) times daily with a meal. 02/18/23   Wilson, Edsel Fuller, CNM  ibuprofen  (ADVIL ) 600 MG tablet Take 1 tablet (600 mg total) by mouth every 6 (six) hours as needed for cramping, fever or mild pain. 02/18/23   Tanda Edsel Fuller, CNM  Prenatal Vit-Fe Fumarate-FA (MULTIVITAMIN-PRENATAL) 27-0.8 MG TABS tablet Take  1 tablet by mouth daily at 12 noon.    [provider]  senna-docusate (SENOKOT-S) 8.6-50 MG tablet Take 2 tablets by mouth at bedtime as needed for mild constipation. 02/18/23   Tanda Edsel Fuller, CNM  witch hazel-glycerin  (TUCKS) pad Apply 1 Application topically as needed for hemorrhoids. 02/18/23   Tanda Edsel Fuller, CNM    Family History History reviewed. No pertinent family history.  Social History Social History   Tobacco Use   Smoking status: Former    Current packs/day: 0.50    Types: Cigarettes   Smokeless tobacco: Never  Vaping Use   Vaping status: Never Used  Substance Use Topics   Alcohol use: Not Currently   Drug use: No     Allergies   Patient has no known allergies.   Review of Systems Review of Systems  Musculoskeletal:  Positive for  arthralgias. Negative for joint swelling.  Skin:  Negative for color change.  Neurological:  Positive for weakness. Negative for numbness.     Physical Exam Triage Vital Signs ED Triage Vitals  Encounter Vitals Group     BP 03/12/24 1913 (!) 133/90     Girls Systolic BP Percentile --      Girls Diastolic BP Percentile --      Boys Systolic BP Percentile --      Boys Diastolic BP Percentile --      Pulse Rate 03/12/24 1913 85     Resp 03/12/24 1913 16     Temp 03/12/24 1913 98.2 F (36.8 C)     Temp Source 03/12/24 1913 Oral     SpO2 03/12/24 1913 100 %     Weight 03/12/24 1912 138 lb 9.6 oz (62.9 kg)     Height 03/12/24 1912 5' 1 (1.549 m)     Head Circumference --      Peak Flow --      Pain Score 03/12/24 1915 3     Pain Loc --      Pain Education --      Exclude from Growth Chart --    No data found.  Updated Vital Signs BP (!) 133/90 (BP Location: Left Arm)   Pulse 85   Temp 98.2 F (36.8 C) (Oral)   Resp 16   Ht 5' 1 (1.549 m)   Wt 138 lb 9.6 oz (62.9 kg)   LMP 03/03/2024 (Approximate)   SpO2 100%   Breastfeeding No   BMI 26.19 kg/m   Visual Acuity Right Eye Distance:   Left Eye Distance:   Bilateral Distance:    Right Eye Near:   Left Eye Near:    Bilateral Near:     Physical Exam Vitals and nursing note reviewed.  Constitutional:      Appearance: Normal appearance. She is not ill-appearing.  HENT:     Head: Normocephalic and atraumatic.  Musculoskeletal:        General: Tenderness present. No swelling or signs of injury.  Skin:    General: Skin is warm and dry.     Capillary Refill: Capillary refill takes less than 2 seconds.     Findings: No bruising, erythema or rash.  Neurological:     General: No focal deficit present.     Mental Status: She is alert and oriented to person, place, and time.      UC Treatments / Results  Labs (all labs ordered are listed, but only abnormal results are displayed) Labs Reviewed - No data to  display  EKG   Radiology No results found.  Procedures Procedures (including critical care time)  Medications Ordered in UC Medications - No data to display  Initial Impression / Assessment and Plan / UC Course  I have reviewed the triage vital signs and the nursing notes.  Pertinent labs & imaging results that were available during my care of the patient were reviewed by me and considered in my medical decision making (see chart for details).   Patient is a pleasant, nontoxic-appearing 33 year old female presenting for evaluation of pain in the right wrist as outlined in HPI above.  In exam room her wrist is in normal anatomical alignment and free of edema, ecchymosis, or erythema.  Full sensation is present in her fingers and her cap refill is less than 3 seconds.  Radial and ulnar pulses are 2+.  Grip strength is 5/5 in the hand.  She is complaining of pain directly over the ulnar styloid though with palpation the pain is not really reproducible.  Also no appreciable reproduction of pain with palpation of the carpal bones, metacarpals, phalanges, or palpation of the length of the ulna.  Range of motion is intact though it does cause discomfort.  Given that she has had a previous fracture I suspect this may be an arthritis flare or possible tendinopathy.  She reports that she did lose grip of her toddler and dropped him earlier.  However, I will obtain a radiograph to evaluate for any acute bony abnormality.  Right wrist x-rays were independently reviewed and evaluated by me.  Impression: No evidence of fracture or dislocation.  Soft tissue unremarkable.  Radiology overread is pending. Radiology impression states negative radiographs of right wrist.  I will discharge patient with a diagnosis of right wrist pain and have staff fit her with a wrist brace to provide support and comfort.  She can use over-the-counter ibuprofen  or Tylenol  as needed for pain.  She must apply ice to her wrist for  claimants at a time, 2-3 times a day, as needed for pain or inflammation.  If her symptoms do not improve she should follow-up with orthopedics.   Final Clinical Impressions(s) / UC Diagnoses   Final diagnoses:  Right wrist pain     Discharge Instructions      Your x-rays did not show any evidence of broken or dislocated bones.  I suspect your pain is either an arthritis flare or it is a tendinopathy, both of which are treated the same.  Use over-the-counter ibuprofen  according to the package instructions as needed for pain and inflammation.  You may also supplement this with Tylenol .  Wear the wrist brace to help keep your wrist in a neutral position and protect it from injury.  You may remove the wrist brace to apply ice to your wrist for 20 minutes at a time, 2-3 times a day, to help with pain and inflammation.  Follow the home physical therapy exercises given your discharge instructions to help maintain range of motion and improve strength.  If your symptoms do not improve, or they worsen, I recommend you follow-up with orthopedics such as [facility] here in [place] or in [place].     ED Prescriptions   None    PDMP not reviewed this encounter.   Bernardino Ditch, NP 03/12/24 1948    Bernardino Ditch, NP 03/13/24 [phone]

## 2024-03-12 NOTE — Discharge Instructions (Addendum)
 Your x-rays did not show any evidence of broken or dislocated bones.  I suspect your pain is either an arthritis flare or it is a tendinopathy, both of which are treated the same.  Use over-the-counter ibuprofen  according to the package instructions as needed for pain and inflammation.  You may also supplement this with Tylenol .  Wear the wrist brace to help keep your wrist in a neutral position and protect it from injury.  You may remove the wrist brace to apply ice to your wrist for 20 minutes at a time, 2-3 times a day, to help with pain and inflammation.  Follow the home physical therapy exercises given your discharge instructions to help maintain range of motion and improve strength.  If your symptoms do not improve, or they worsen, I recommend you follow-up with orthopedics such as [facility] here in [place] or in [place].

## 2024-03-13 ENCOUNTER — Ambulatory Visit (HOSPITAL_COMMUNITY): Payer: Self-pay

## 2024-06-29 ENCOUNTER — Ambulatory Visit (INDEPENDENT_AMBULATORY_CARE_PROVIDER_SITE_OTHER)

## 2024-06-29 ENCOUNTER — Encounter: Payer: Self-pay | Admitting: Emergency Medicine

## 2024-06-29 ENCOUNTER — Ambulatory Visit
Admission: EM | Admit: 2024-06-29 | Discharge: 2024-06-29 | Disposition: A | Discharge status: Home / Self Care | Attending: Nurse Practitioner | Admitting: Nurse Practitioner

## 2024-06-29 ENCOUNTER — Ambulatory Visit: Payer: Self-pay | Admitting: Nurse Practitioner

## 2024-06-29 DIAGNOSIS — M79674 Pain in right toe(s): Secondary | ICD-10-CM

## 2024-06-29 DIAGNOSIS — S92514A Nondisplaced fracture of proximal phalanx of right lesser toe(s), initial encounter for closed fracture: Secondary | ICD-10-CM

## 2024-06-29 NOTE — ED Triage Notes (Signed)
 Pt c/o right pinky toe pain. She states she stubbed her toe on her toddlers foot this morning. She has some bruising and swelling int he area.

## 2024-06-29 NOTE — Discharge Instructions (Addendum)
 Please wear the post op shoe to help with the broken toe bone.  You can take Tylenol  or ibuprofen  as needed for toe pain and recommend ice, elevation, and rest to help it heal.  If symptoms do not improve with treatment, follow up with Podiatry.

## 2024-06-30 NOTE — ED Provider Notes (Signed)
 [facility]   CSN: [id number] Arrival date & time: 06/29/24  1224      History   Chief Complaint Chief Complaint  Patient presents with   Toe Pain    right    HPI Kendra Cole is a 33 y.o. female.   Patient presents today with 1 day history of right pinky toe pain.  Reports she stubbed her toe on her toddler this morning.  She reports area is starting to bruise and looks more swollen than normal.  No numbness or tingling the tip of the toe.  She is able to move the toe minimally.  Has not taken anything for pain so far.  Denies history of broken toe bones or toe surgeries.    Past Medical History:  Diagnosis Date   Anxiety    Asthma    History of stomach ulcers     Patient Active Problem List   Diagnosis Date Noted   Viral URI with cough 09/06/2023   NSVD (normal spontaneous vaginal delivery) 02/17/2023   Edema in pregnancy in third trimester 02/03/2023   Supervision of normal pregnancy 07/25/2022   Left ankle pain 06/26/2022   Pregnancy test positive 06/26/2022   Attention deficit disorder 08/31/2020   Dysmenorrhea 08/31/2020   Short attention span 08/31/2020   Impetigo 01/14/2020   Insomnia, unspecified 07/05/2018   Pain in pelvis 03/19/2018   Routine history and physical examination of adult 09/17/2017   Anxiety states 04/24/2017   Family history of breast cancer 01/22/2017   Low grade squamous intraepith lesion on cytologic smear cervix (lgsil) 09/19/2016   Major depression, recurrent 05/03/2015   Tobacco dependence syndrome 11/25/2013    Past Surgical History:  Procedure Laterality Date   NO PAST SURGERIES      OB History     Gravida  3   Para  2   Term  1   Preterm      AB      Living  1      SAB      IAB      Ectopic      Multiple  0   Live Births  2            Home Medications    Prior to Admission medications   Medication Sig Start Date End Date Taking? Authorizing Provider  acetaminophen  (TYLENOL ) 325 MG  tablet Take 2 tablets (650 mg total) by mouth every 6 (six) hours as needed for fever or mild pain (for pain scale < 4). 02/18/23   Tanda Edsel Fuller, CNM  albuterol  (VENTOLIN  HFA) 108 (90 Base) MCG/ACT inhaler Inhale 2 puffs into the lungs every 4 (four) hours as needed for wheezing or shortness of breath. 08/18/22   White, Shelba SAUNDERS, NP  benzocaine -Menthol  (DERMOPLAST) 20-0.5 % AERO Apply 1 Application topically as needed for irritation (perineal discomfort). 02/18/23   Wilson, Danielle Renee, CNM  clotrimazole-betamethasone (LOTRISONE) cream Apply topically 2 (two) times daily 10/24/23 10/23/24  [provider]  ferrous sulfate  325 (65 FE) MG tablet Take 1 tablet (325 mg total) by mouth 2 (two) times daily with a meal. 02/18/23   Wilson, Edsel Fuller, CNM  ibuprofen  (ADVIL ) 600 MG tablet Take 1 tablet (600 mg total) by mouth every 6 (six) hours as needed for cramping, fever or mild pain. 02/18/23   Tanda Edsel Fuller, CNM  Prenatal Vit-Fe Fumarate-FA (MULTIVITAMIN-PRENATAL) 27-0.8 MG TABS tablet Take 1 tablet by mouth daily at 12 noon.    [provider]  senna-docusate (SENOKOT-S) 8.6-50 MG tablet Take 2 tablets by mouth at bedtime as needed for mild constipation. 02/18/23   Tanda Edsel Fuller, CNM  witch hazel-glycerin  (TUCKS) pad Apply 1 Application topically as needed for hemorrhoids. 02/18/23   Tanda Edsel Fuller, CNM    Family History History reviewed. No pertinent family history.  Social History Social History   Tobacco Use   Smoking status: Every Day    Current packs/day: 0.50    Types: Cigarettes   Smokeless tobacco: Never  Vaping Use   Vaping status: Never Used  Substance Use Topics   Alcohol use: Not Currently   Drug use: No     Allergies   Patient has no known allergies.   Review of Systems Review of Systems Per HPI  Physical Exam Triage Vital Signs ED Triage Vitals  Encounter Vitals Group     BP 06/29/24 1309 133/88     Girls  Systolic BP Percentile --      Girls Diastolic BP Percentile --      Boys Systolic BP Percentile --      Boys Diastolic BP Percentile --      Pulse Rate 06/29/24 1309 77     Resp 06/29/24 1309 16     Temp 06/29/24 1309 98.1 F (36.7 C)     Temp Source 06/29/24 1309 Oral     SpO2 06/29/24 1309 100 %     Weight 06/29/24 1308 138 lb 10.7 oz (62.9 kg)     Height 06/29/24 1308 5' 1 (1.549 m)     Head Circumference --      Peak Flow --      Pain Score 06/29/24 1307 3     Pain Loc --      Pain Education --      Exclude from Growth Chart --    No data found.  Updated Vital Signs BP 133/88 (BP Location: Left Arm)   Pulse 77   Temp 98.1 F (36.7 C) (Oral)   Resp 16   Ht 5' 1 (1.549 m)   Wt 138 lb 10.7 oz (62.9 kg)   LMP 06/08/2024 (Approximate)   SpO2 100%   Breastfeeding No   BMI 26.20 kg/m   Visual Acuity Right Eye Distance:   Left Eye Distance:   Bilateral Distance:    Right Eye Near:   Left Eye Near:    Bilateral Near:     Physical Exam Vitals and nursing note reviewed.  Constitutional:      General: She is not in acute distress.    Appearance: Normal appearance. She is not toxic-appearing.  HENT:     Mouth/Throat:     Mouth: Mucous membranes are moist.     Pharynx: Oropharynx is clear.  Pulmonary:     Effort: Pulmonary effort is normal. No respiratory distress.  Musculoskeletal:     Comments: Inspection: Mild swelling and bruising noted to right proximal phalanx of right lower extremity fifth digit; no obvious deformity or redness  Palpation: tender to palpation right lower extremity fifth digit diffusely; no obvious deformities palpated ROM: Difficult to assess secondary to pain Strength: Difficult to assess secondary to pain Neurovascular: neurovascularly intact in distal right lower extremity fifth digit  Skin:    General: Skin is warm and dry.     Capillary Refill: Capillary refill takes less than 2 seconds.     Coloration: Skin is not jaundiced or  pale.     Findings: No erythema.  Neurological:  Mental Status: She is alert and oriented to person, place, and time.  Psychiatric:        Behavior: Behavior is cooperative.      UC Treatments / Results  Labs (all labs ordered are listed, but only abnormal results are displayed) Labs Reviewed - No data to display  EKG   Radiology DG Toe 5th Right Result Date: 06/29/2024 CLINICAL DATA:  Right small toe pain after stubbing toe EXAM: RIGHT FIFTH TOE COMPARISON:  None Available. FINDINGS: Transverse fracture of the small toe proximal phalangeal shaft. No substantial displacement. No acute dislocation. Soft tissue swelling of the small toe. IMPRESSION: Nondisplaced transverse fracture of the small toe proximal phalangeal shaft. Electronically Signed   By: Limin  Xu M.D.   On: 06/29/2024 14:05    Procedures Procedures (including critical care time)  Medications Ordered in UC Medications - No data to display  Initial Impression / Assessment and Plan / UC Course  I have reviewed the triage vital signs and the nursing notes.  Pertinent labs & imaging results that were available during my care of the patient were reviewed by me and considered in my medical decision making (see chart for details).   Patient is a pleasant 33 year old female presenting for right little toe pain after accidentally stubbing it on her toddler's foot this morning.  On exam, she has minimal swelling and bruising.  X-ray imaging today shows fracture of the proximal phalanx of right lower extremity 5th digit.  Postop shoe was applied.  Supportive care was discussed.  Recommended follow-up with podiatry if symptoms not improved with treatment.  The patient was given the opportunity to ask questions.  All questions answered to their satisfaction.  The patient is in agreement to this plan.   Final Clinical Impressions(s) / UC Diagnoses   Final diagnoses:  Pain of toe of right foot  Closed nondisplaced fracture  of proximal phalanx of lesser toe of right foot, initial encounter     Discharge Instructions      Please wear the post op shoe to help with the broken toe bone.  You can take Tylenol  or ibuprofen  as needed for toe pain and recommend ice, elevation, and rest to help it heal.  If symptoms do not improve with treatment, follow up with Podiatry.    ED Prescriptions   None    PDMP not reviewed this encounter.   Chandra Harlene LABOR, NP 06/30/24 [id number]
